# Patient Record
Sex: Male | Born: 1945 | Race: Black or African American | Hispanic: No | Marital: Married | Smoking: Never smoker
Health system: Southern US, Community
[De-identification: ages and names within clinical notes are randomized; demographics above are authoritative.]

## PROBLEM LIST (undated history)

## (undated) DIAGNOSIS — I219 Acute myocardial infarction, unspecified: Secondary | ICD-10-CM

## (undated) DIAGNOSIS — E785 Hyperlipidemia, unspecified: Secondary | ICD-10-CM

## (undated) DIAGNOSIS — K219 Gastro-esophageal reflux disease without esophagitis: Secondary | ICD-10-CM

## (undated) DIAGNOSIS — H269 Unspecified cataract: Secondary | ICD-10-CM

## (undated) DIAGNOSIS — N289 Disorder of kidney and ureter, unspecified: Secondary | ICD-10-CM

## (undated) DIAGNOSIS — G4730 Sleep apnea, unspecified: Secondary | ICD-10-CM

## (undated) DIAGNOSIS — E119 Type 2 diabetes mellitus without complications: Secondary | ICD-10-CM

## (undated) DIAGNOSIS — I10 Essential (primary) hypertension: Secondary | ICD-10-CM

## (undated) DIAGNOSIS — T7840XA Allergy, unspecified, initial encounter: Secondary | ICD-10-CM

## (undated) HISTORY — DX: Sleep apnea, unspecified: G47.30

## (undated) HISTORY — PX: BLADDER STONE REMOVAL: SHX568

## (undated) HISTORY — DX: Gastro-esophageal reflux disease without esophagitis: K21.9

## (undated) HISTORY — DX: Allergy, unspecified, initial encounter: T78.40XA

## (undated) HISTORY — PX: REPLACEMENT TOTAL KNEE: SUR1224

## (undated) HISTORY — PX: OTHER SURGICAL HISTORY: SHX169

## (undated) HISTORY — DX: Unspecified cataract: H26.9

## (undated) HISTORY — DX: Hyperlipidemia, unspecified: E78.5

---

## 2002-07-12 ENCOUNTER — Encounter: Admission: RE | Admit: 2002-07-12 | Discharge: 2002-07-12 | Payer: Self-pay | Admitting: Internal Medicine

## 2002-07-12 ENCOUNTER — Encounter: Payer: Self-pay | Admitting: Internal Medicine

## 2002-11-27 ENCOUNTER — Encounter: Admission: RE | Admit: 2002-11-27 | Discharge: 2002-11-27 | Payer: Self-pay | Admitting: Internal Medicine

## 2002-11-27 ENCOUNTER — Encounter: Payer: Self-pay | Admitting: Internal Medicine

## 2003-03-16 ENCOUNTER — Encounter: Payer: Self-pay | Admitting: Internal Medicine

## 2003-03-16 ENCOUNTER — Encounter: Admission: RE | Admit: 2003-03-16 | Discharge: 2003-03-16 | Payer: Self-pay | Admitting: Internal Medicine

## 2006-06-25 ENCOUNTER — Encounter: Admission: RE | Admit: 2006-06-25 | Discharge: 2006-06-25 | Payer: Self-pay | Admitting: Internal Medicine

## 2006-07-26 ENCOUNTER — Encounter: Admission: RE | Admit: 2006-07-26 | Discharge: 2006-07-26 | Payer: Self-pay | Admitting: Internal Medicine

## 2010-07-27 ENCOUNTER — Encounter: Payer: Self-pay | Admitting: Internal Medicine

## 2016-12-26 ENCOUNTER — Emergency Department (HOSPITAL_BASED_OUTPATIENT_CLINIC_OR_DEPARTMENT_OTHER)
Admission: EM | Admit: 2016-12-26 | Discharge: 2016-12-26 | Disposition: A | Discharge status: Home / Self Care | Payer: BC Managed Care – PPO | Attending: Emergency Medicine | Admitting: Emergency Medicine

## 2016-12-26 ENCOUNTER — Encounter (HOSPITAL_BASED_OUTPATIENT_CLINIC_OR_DEPARTMENT_OTHER): Payer: Self-pay | Admitting: Emergency Medicine

## 2016-12-26 DIAGNOSIS — E119 Type 2 diabetes mellitus without complications: Secondary | ICD-10-CM | POA: Diagnosis not present

## 2016-12-26 DIAGNOSIS — R339 Retention of urine, unspecified: Secondary | ICD-10-CM

## 2016-12-26 DIAGNOSIS — R300 Dysuria: Secondary | ICD-10-CM | POA: Diagnosis present

## 2016-12-26 DIAGNOSIS — I10 Essential (primary) hypertension: Secondary | ICD-10-CM | POA: Insufficient documentation

## 2016-12-26 HISTORY — DX: Acute myocardial infarction, unspecified: I21.9

## 2016-12-26 HISTORY — DX: Essential (primary) hypertension: I10

## 2016-12-26 HISTORY — DX: Disorder of kidney and ureter, unspecified: N28.9

## 2016-12-26 HISTORY — DX: Type 2 diabetes mellitus without complications: E11.9

## 2016-12-26 LAB — COMPREHENSIVE METABOLIC PANEL
ALT: 17 U/L (ref 17–63)
ANION GAP: 13 (ref 5–15)
AST: 22 U/L (ref 15–41)
Albumin: 3.1 g/dL — ABNORMAL LOW (ref 3.5–5.0)
Alkaline Phosphatase: 62 U/L (ref 38–126)
BILIRUBIN TOTAL: 0.9 mg/dL (ref 0.3–1.2)
BUN: 13 mg/dL (ref 6–20)
CHLORIDE: 99 mmol/L — AB (ref 101–111)
CO2: 21 mmol/L — ABNORMAL LOW (ref 22–32)
Calcium: 9 mg/dL (ref 8.9–10.3)
Creatinine, Ser: 0.9 mg/dL (ref 0.61–1.24)
GFR calc Af Amer: >60 mL/min (ref >60)
GLUCOSE: 244 mg/dL — AB (ref 65–99)
POTASSIUM: 3.9 mmol/L (ref 3.5–5.1)
Sodium: 133 mmol/L — ABNORMAL LOW (ref 135–145)
TOTAL PROTEIN: 7.2 g/dL (ref 6.5–8.1)

## 2016-12-26 LAB — CBC WITH DIFFERENTIAL/PLATELET
BASOS ABS: 0 10*3/uL (ref 0.0–0.1)
Basophils Relative: 0 %
EOS PCT: 2 %
Eosinophils Absolute: 0.1 10*3/uL (ref 0.0–0.7)
HEMATOCRIT: 31.4 % — AB (ref 39.0–52.0)
HEMOGLOBIN: 10.8 g/dL — AB (ref 13.0–17.0)
LYMPHS PCT: 21 %
Lymphs Abs: 1.5 10*3/uL (ref 0.7–4.0)
MCH: 30 pg (ref 26.0–34.0)
MCHC: 34.4 g/dL (ref 30.0–36.0)
MCV: 87.2 fL (ref 78.0–100.0)
MONOS PCT: 14 %
Monocytes Absolute: 1 10*3/uL (ref 0.1–1.0)
Neutro Abs: 4.6 10*3/uL (ref 1.7–7.7)
Neutrophils Relative %: 63 %
Platelets: 298 10*3/uL (ref 150–400)
RBC: 3.6 MIL/uL — AB (ref 4.22–5.81)
RDW: 14 % (ref 11.5–15.5)
WBC: 7.2 10*3/uL (ref 4.0–10.5)

## 2016-12-26 LAB — URINALYSIS, ROUTINE W REFLEX MICROSCOPIC
Bilirubin Urine: NEGATIVE
Glucose, UA: >=500 mg/dL — AB
Hgb urine dipstick: NEGATIVE
Ketones, ur: NEGATIVE mg/dL
LEUKOCYTES UA: NEGATIVE
NITRITE: NEGATIVE
PH: 5 (ref 5.0–8.0)
Protein, ur: NEGATIVE mg/dL
SPECIFIC GRAVITY, URINE: 1.026 (ref 1.005–1.030)

## 2016-12-26 LAB — I-STAT CG4 LACTIC ACID, ED: LACTIC ACID, VENOUS: 2.03 mmol/L — AB (ref 0.5–1.9)

## 2016-12-26 LAB — URINALYSIS, MICROSCOPIC (REFLEX): RBC / HPF: NONE SEEN RBC/hpf (ref 0–5)

## 2016-12-26 MED ORDER — TAMSULOSIN HCL 0.4 MG PO CAPS: 0.4000 mg | ORAL_CAPSULE | Freq: Every day | ORAL | 0 refills | Status: DC

## 2016-12-26 NOTE — ED Triage Notes (Signed)
Pt was discharged from the hospital Friday following L knee replacement and has been having dysuria and urinary frequency since then. Pt states he did have a foley cath while in the hospital.

## 2016-12-26 NOTE — ED Provider Notes (Signed)
[facility] Provider Note   CSN: [id number] Arrival date & time: 12/26/16  5465 By signing my name below, I, Dyke Brackett, attest that this documentation has been prepared under the direction and in the presence of Davonna Belling, MD . Electronically Signed: Dyke Brackett, Scribe. 12/26/2016. 7:29 PM.   History   Chief Complaint Chief Complaint  Patient presents with  . Dysuria   HPI Jake Montgomery is a 71 y.o. male who presents to the Emergency Department complaining of dysuria onset yesterday. He reports associated difficulty urinating, increased urinary frequency, nausea, and abdominal pain. No alleviating or modifying factors noted. Pt was d/c from the hospital yesterday following total knee replacement. Per pt, he did have a foley catheter in place while in the hospital. Prior to last night, pt had not experienced any urinary symptoms. Pt denies any diarrhea, vomiting, dizziness, or lightheadedness.   The history is provided by the patient. No language interpreter was used.    Past Medical History:  Diagnosis Date  . Diabetes mellitus without complication ([place])   . Hypertension   . MI (myocardial infarction) ([place])   . Renal disorder     There are no active problems to display for this patient.   Past Surgical History:  Procedure Laterality Date  . REPLACEMENT TOTAL KNEE Left        Home Medications    Prior to Admission medications   Medication Sig Start Date End Date Taking? Authorizing Provider  UNKNOWN TO PATIENT    Yes [provider]  tamsulosin (FLOMAX) 0.4 MG CAPS capsule Take 1 capsule (0.4 mg total) by mouth daily. 12/26/16   Davonna Belling, MD    Family History No family history on file.  Social History Social History  Substance Use Topics  . Smoking status: Never Smoker  . Smokeless tobacco: Never Used  . Alcohol use Yes     Comment: occ     Allergies   Patient has no known allergies.   Review of  Systems Review of Systems  Constitutional: Negative for chills and fever.  HENT: Negative for congestion.   Respiratory: Negative for shortness of breath.   Cardiovascular: Positive for leg swelling. Negative for chest pain.  Gastrointestinal: Positive for abdominal pain and nausea. Negative for diarrhea and vomiting.  Genitourinary: Positive for decreased urine volume, difficulty urinating, dysuria and frequency.  Musculoskeletal: Negative for back pain.  Skin: Negative for rash.  Neurological: Negative for dizziness and light-headedness.   Physical Exam Updated Vital Signs BP 123/66   Pulse (!) 107   Temp 98.7 F (37.1 C) (Oral)   Resp 20   Ht 5\' 7"  (1.702 m)   Wt 106.1 kg (234 lb)   SpO2 100%   BMI 36.65 kg/m   Physical Exam  Constitutional: He is oriented to person, place, and time. He appears well-developed and well-nourished. No distress.  HENT:  Head: Normocephalic and atraumatic.  Eyes: Conjunctivae are normal.  Cardiovascular: Normal rate.   Pulmonary/Chest: Effort normal.  Abdominal: He exhibits no distension.  Suprapubic lower abdominal mass which goes up to umbilicus. No hernias.   Genitourinary:  Genitourinary Comments: No penile discharge.  Musculoskeletal:  Mild BLE pitting edema  Neurological: He is alert and oriented to person, place, and time.  Skin: Skin is warm and dry.  Dressing over left knee with slight bloody stain on it.   Nursing note and vitals reviewed.  ED Treatments / Results  DIAGNOSTIC STUDIES:  Oxygen Saturation is 100% on RA,  normal by my interpretation.    COORDINATION OF CARE:  7:28 PM Discussed treatment plan with pt at bedside and pt agreed to plan.   Labs (all labs ordered are listed, but only abnormal results are displayed) Labs Reviewed  COMPREHENSIVE METABOLIC PANEL - Abnormal; Notable for the following:       Result Value   Sodium 133 (*)    Chloride 99 (*)    CO2 21 (*)    Glucose, Bld 244 (*)    Albumin 3.1 (*)     All other components within normal limits  CBC WITH DIFFERENTIAL/PLATELET - Abnormal; Notable for the following:    RBC 3.60 (*)    Hemoglobin 10.8 (*)    HCT 31.4 (*)    All other components within normal limits  URINALYSIS, ROUTINE W REFLEX MICROSCOPIC - Abnormal; Notable for the following:    Glucose, UA >=500 (*)    All other components within normal limits  URINALYSIS, MICROSCOPIC (REFLEX) - Abnormal; Notable for the following:    Bacteria, UA FEW (*)    Squamous Epithelial / LPF 0-5 (*)    All other components within normal limits  I-STAT CG4 LACTIC ACID, ED - Abnormal; Notable for the following:    Lactic Acid, Venous 2.03 (*)    All other components within normal limits    EKG  EKG Interpretation None       Radiology No results found.  Procedures Procedures (including critical care time)  Medications Ordered in ED Medications - No data to display   Initial Impression / Assessment and Plan / ED Course  I have reviewed the triage vital signs and the nursing notes.  Pertinent labs & imaging results that were available during my care of the patient were reviewed by me and considered in my medical decision making (see chart for details).     Patient with dysuria after recent surgery. Found to have urinary retention. Feels much better after Foley catheter placed. Will treat with Flomax. No infection. Renal function today. Discharge home to follow with urology.  Final Clinical Impressions(s) / ED Diagnoses   Final diagnoses:  Urinary retention    New Prescriptions Discharge Medication List as of 12/26/2016  9:25 PM    START taking these medications   Details  tamsulosin (FLOMAX) 0.4 MG CAPS capsule Take 1 capsule (0.4 mg total) by mouth daily., Starting Sat 12/26/2016, Print       I personally performed the services described in this documentation, which was scribed in my presence. The recorded information has been reviewed and is accurate.       Davonna Belling, MD 12/26/16 2325

## 2016-12-26 NOTE — ED Notes (Addendum)
Alert, NAD, calm, interactive, resps e/u, speaking in clear complete sentences, no dyspnea noted, skin W&D, VSS, HR elevated 103, (denies: pain, sob, nausea, dizziness or visual changes).Family at [facility]. Leg bag applied.

## 2016-12-26 NOTE — ED Notes (Signed)
Report received from K.C., RN.

## 2017-05-25 ENCOUNTER — Emergency Department (HOSPITAL_BASED_OUTPATIENT_CLINIC_OR_DEPARTMENT_OTHER): Payer: BC Managed Care – PPO

## 2017-05-25 ENCOUNTER — Other Ambulatory Visit: Payer: Self-pay

## 2017-05-25 ENCOUNTER — Emergency Department (HOSPITAL_BASED_OUTPATIENT_CLINIC_OR_DEPARTMENT_OTHER)
Admission: EM | Admit: 2017-05-25 | Discharge: 2017-05-25 | Disposition: A | Discharge status: Home / Self Care | Payer: BC Managed Care – PPO | Attending: Emergency Medicine | Admitting: Emergency Medicine

## 2017-05-25 ENCOUNTER — Encounter (HOSPITAL_BASED_OUTPATIENT_CLINIC_OR_DEPARTMENT_OTHER): Payer: Self-pay | Admitting: *Deleted

## 2017-05-25 DIAGNOSIS — I10 Essential (primary) hypertension: Secondary | ICD-10-CM | POA: Insufficient documentation

## 2017-05-25 DIAGNOSIS — Y998 Other external cause status: Secondary | ICD-10-CM | POA: Diagnosis not present

## 2017-05-25 DIAGNOSIS — M542 Cervicalgia: Secondary | ICD-10-CM

## 2017-05-25 DIAGNOSIS — S39012A Strain of muscle, fascia and tendon of lower back, initial encounter: Secondary | ICD-10-CM | POA: Diagnosis not present

## 2017-05-25 DIAGNOSIS — E119 Type 2 diabetes mellitus without complications: Secondary | ICD-10-CM | POA: Diagnosis not present

## 2017-05-25 DIAGNOSIS — Z79899 Other long term (current) drug therapy: Secondary | ICD-10-CM | POA: Insufficient documentation

## 2017-05-25 DIAGNOSIS — Y92410 Unspecified street and highway as the place of occurrence of the external cause: Secondary | ICD-10-CM | POA: Diagnosis not present

## 2017-05-25 DIAGNOSIS — Z96652 Presence of left artificial knee joint: Secondary | ICD-10-CM | POA: Diagnosis not present

## 2017-05-25 DIAGNOSIS — Y9389 Activity, other specified: Secondary | ICD-10-CM | POA: Insufficient documentation

## 2017-05-25 DIAGNOSIS — I252 Old myocardial infarction: Secondary | ICD-10-CM | POA: Insufficient documentation

## 2017-05-25 DIAGNOSIS — S3992XA Unspecified injury of lower back, initial encounter: Secondary | ICD-10-CM | POA: Diagnosis present

## 2017-05-25 MED ORDER — CYCLOBENZAPRINE HCL 10 MG PO TABS: 10.0000 mg | ORAL_TABLET | Freq: Two times a day (BID) | ORAL | 0 refills | Status: DC | PRN

## 2017-05-25 MED ORDER — ACETAMINOPHEN 325 MG PO TABS
650.0000 mg | ORAL_TABLET | Freq: Once | ORAL | Status: AC
  Administered 2017-05-25: 650 mg via ORAL
  Filled 2017-05-25: qty 2

## 2017-05-25 NOTE — Discharge Instructions (Signed)
The pain your experiencing is likely due to muscle strain, you may take tylenol and flexeril as needed for pain management. Do not combine with any pain reliever other than tylenol. The muscle soreness should improve over the next week. Follow up with your family doctor in the next week for a recheck if you are still having symptoms. Return to ED if pain is worsening, you develop weakness or numbness of extremities, or new or concerning symptoms develop.
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
## 2017-11-23 ENCOUNTER — Ambulatory Visit (INDEPENDENT_AMBULATORY_CARE_PROVIDER_SITE_OTHER): Payer: Self-pay | Admitting: Orthopaedic Surgery

## 2017-12-20 ENCOUNTER — Ambulatory Visit (INDEPENDENT_AMBULATORY_CARE_PROVIDER_SITE_OTHER): Payer: Self-pay | Admitting: Orthopaedic Surgery

## 2018-01-12 ENCOUNTER — Ambulatory Visit (INDEPENDENT_AMBULATORY_CARE_PROVIDER_SITE_OTHER): Payer: Self-pay | Admitting: Orthopaedic Surgery
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
## 2021-03-18 ENCOUNTER — Encounter (HOSPITAL_BASED_OUTPATIENT_CLINIC_OR_DEPARTMENT_OTHER): Payer: Self-pay | Admitting: Emergency Medicine

## 2021-03-18 ENCOUNTER — Emergency Department (HOSPITAL_BASED_OUTPATIENT_CLINIC_OR_DEPARTMENT_OTHER)
Admission: EM | Admit: 2021-03-18 | Discharge: 2021-03-18 | Disposition: A | Discharge status: Home / Self Care | Payer: Medicare (Managed Care) | Attending: Emergency Medicine | Admitting: Emergency Medicine

## 2021-03-18 ENCOUNTER — Emergency Department (HOSPITAL_COMMUNITY): Payer: Medicare (Managed Care)

## 2021-03-18 ENCOUNTER — Other Ambulatory Visit: Payer: Self-pay

## 2021-03-18 ENCOUNTER — Other Ambulatory Visit: Payer: Self-pay | Admitting: Emergency Medicine

## 2021-03-18 ENCOUNTER — Emergency Department (HOSPITAL_BASED_OUTPATIENT_CLINIC_OR_DEPARTMENT_OTHER): Payer: Medicare (Managed Care)

## 2021-03-18 ENCOUNTER — Inpatient Hospital Stay (HOSPITAL_COMMUNITY)
Admission: EM | Admit: 2021-03-18 | Discharge: 2021-03-23 | DRG: 312 | Disposition: A | Discharge status: Home Health | Payer: Medicare (Managed Care) | Attending: Internal Medicine | Admitting: Internal Medicine

## 2021-03-18 DIAGNOSIS — R2990 Unspecified symptoms and signs involving the nervous system: Secondary | ICD-10-CM

## 2021-03-18 DIAGNOSIS — Z9989 Dependence on other enabling machines and devices: Secondary | ICD-10-CM

## 2021-03-18 DIAGNOSIS — R81 Glycosuria: Secondary | ICD-10-CM | POA: Diagnosis present

## 2021-03-18 DIAGNOSIS — I10 Essential (primary) hypertension: Secondary | ICD-10-CM | POA: Insufficient documentation

## 2021-03-18 DIAGNOSIS — I952 Hypotension due to drugs: Secondary | ICD-10-CM | POA: Diagnosis not present

## 2021-03-18 DIAGNOSIS — Z20822 Contact with and (suspected) exposure to covid-19: Secondary | ICD-10-CM | POA: Insufficient documentation

## 2021-03-18 DIAGNOSIS — R310 Gross hematuria: Principal | ICD-10-CM | POA: Diagnosis present

## 2021-03-18 DIAGNOSIS — R519 Headache, unspecified: Secondary | ICD-10-CM | POA: Insufficient documentation

## 2021-03-18 DIAGNOSIS — Z79899 Other long term (current) drug therapy: Secondary | ICD-10-CM

## 2021-03-18 DIAGNOSIS — Z87442 Personal history of urinary calculi: Secondary | ICD-10-CM | POA: Diagnosis not present

## 2021-03-18 DIAGNOSIS — E669 Obesity, unspecified: Secondary | ICD-10-CM | POA: Diagnosis present

## 2021-03-18 DIAGNOSIS — R319 Hematuria, unspecified: Secondary | ICD-10-CM

## 2021-03-18 DIAGNOSIS — Z6841 Body Mass Index (BMI) 40.0 and over, adult: Secondary | ICD-10-CM | POA: Diagnosis not present

## 2021-03-18 DIAGNOSIS — N400 Enlarged prostate without lower urinary tract symptoms: Secondary | ICD-10-CM | POA: Diagnosis not present

## 2021-03-18 DIAGNOSIS — R42 Dizziness and giddiness: Secondary | ICD-10-CM | POA: Insufficient documentation

## 2021-03-18 DIAGNOSIS — Y908 Blood alcohol level of 240 mg/100 ml or more: Secondary | ICD-10-CM | POA: Insufficient documentation

## 2021-03-18 DIAGNOSIS — G4733 Obstructive sleep apnea (adult) (pediatric): Secondary | ICD-10-CM

## 2021-03-18 DIAGNOSIS — T447X5A Adverse effect of beta-adrenoreceptor antagonists, initial encounter: Secondary | ICD-10-CM | POA: Diagnosis not present

## 2021-03-18 DIAGNOSIS — Z87440 Personal history of urinary (tract) infections: Secondary | ICD-10-CM | POA: Diagnosis not present

## 2021-03-18 DIAGNOSIS — E119 Type 2 diabetes mellitus without complications: Secondary | ICD-10-CM | POA: Diagnosis present

## 2021-03-18 DIAGNOSIS — Z96652 Presence of left artificial knee joint: Secondary | ICD-10-CM | POA: Insufficient documentation

## 2021-03-18 DIAGNOSIS — R55 Syncope and collapse: Secondary | ICD-10-CM | POA: Diagnosis not present

## 2021-03-18 DIAGNOSIS — Z9181 History of falling: Secondary | ICD-10-CM | POA: Diagnosis not present

## 2021-03-18 DIAGNOSIS — I2510 Atherosclerotic heart disease of native coronary artery without angina pectoris: Secondary | ICD-10-CM | POA: Diagnosis not present

## 2021-03-18 DIAGNOSIS — I440 Atrioventricular block, first degree: Secondary | ICD-10-CM | POA: Diagnosis present

## 2021-03-18 DIAGNOSIS — E1169 Type 2 diabetes mellitus with other specified complication: Secondary | ICD-10-CM | POA: Diagnosis present

## 2021-03-18 DIAGNOSIS — R200 Anesthesia of skin: Secondary | ICD-10-CM | POA: Insufficient documentation

## 2021-03-18 DIAGNOSIS — I252 Old myocardial infarction: Secondary | ICD-10-CM

## 2021-03-18 DIAGNOSIS — R0602 Shortness of breath: Secondary | ICD-10-CM

## 2021-03-18 DIAGNOSIS — E66813 Obesity, class 3: Secondary | ICD-10-CM

## 2021-03-18 DIAGNOSIS — R531 Weakness: Secondary | ICD-10-CM

## 2021-03-18 LAB — COMPREHENSIVE METABOLIC PANEL
ALT: 23 U/L (ref 0–44)
AST: 20 U/L (ref 15–41)
Albumin: 3.7 g/dL (ref 3.5–5.0)
Alkaline Phosphatase: 78 U/L (ref 38–126)
Anion gap: 6 (ref 5–15)
BUN: 9 mg/dL (ref 8–23)
CO2: 25 mmol/L (ref 22–32)
Calcium: 8.7 mg/dL — ABNORMAL LOW (ref 8.9–10.3)
Chloride: 101 mmol/L (ref 98–111)
Creatinine, Ser: 0.93 mg/dL (ref 0.61–1.24)
GFR, Estimated: >60 mL/min (ref >60)
Glucose, Bld: 209 mg/dL — ABNORMAL HIGH (ref 70–99)
Potassium: 4.2 mmol/L (ref 3.5–5.1)
Sodium: 132 mmol/L — ABNORMAL LOW (ref 135–145)
Total Bilirubin: 0.6 mg/dL (ref 0.3–1.2)
Total Protein: 7.4 g/dL (ref 6.5–8.1)

## 2021-03-18 LAB — PROTIME-INR
INR: 1.1 (ref 0.8–1.2)
Prothrombin Time: 13.8 seconds (ref 11.4–15.2)

## 2021-03-18 LAB — RAPID URINE DRUG SCREEN, HOSP PERFORMED
Amphetamines: NOT DETECTED
Barbiturates: NOT DETECTED
Benzodiazepines: NOT DETECTED
Cocaine: NOT DETECTED
Opiates: NOT DETECTED
Tetrahydrocannabinol: NOT DETECTED

## 2021-03-18 LAB — CBG MONITORING, ED: Glucose-Capillary: 196 mg/dL — ABNORMAL HIGH (ref 70–99)

## 2021-03-18 LAB — DIFFERENTIAL
Abs Immature Granulocytes: 0.03 10*3/uL (ref 0.00–0.07)
Basophils Absolute: 0.1 10*3/uL (ref 0.0–0.1)
Basophils Relative: 1 %
Eosinophils Absolute: 0.1 10*3/uL (ref 0.0–0.5)
Eosinophils Relative: 3 %
Immature Granulocytes: 1 %
Lymphocytes Relative: 32 %
Lymphs Abs: 1.7 10*3/uL (ref 0.7–4.0)
Monocytes Absolute: 0.6 10*3/uL (ref 0.1–1.0)
Monocytes Relative: 12 %
Neutro Abs: 2.8 10*3/uL (ref 1.7–7.7)
Neutrophils Relative %: 51 %

## 2021-03-18 LAB — CBC
HCT: 42.9 % (ref 39.0–52.0)
Hemoglobin: 14.5 g/dL (ref 13.0–17.0)
MCH: 30.1 pg (ref 26.0–34.0)
MCHC: 33.8 g/dL (ref 30.0–36.0)
MCV: 89 fL (ref 80.0–100.0)
Platelets: 267 10*3/uL (ref 150–400)
RBC: 4.82 MIL/uL (ref 4.22–5.81)
RDW: 13.7 % (ref 11.5–15.5)
WBC: 5.4 10*3/uL (ref 4.0–10.5)
nRBC: 0 % (ref 0.0–0.2)

## 2021-03-18 LAB — RESP PANEL BY RT-PCR (FLU A&B, COVID) ARPGX2
Influenza A by PCR: NEGATIVE
Influenza B by PCR: NEGATIVE
SARS Coronavirus 2 by RT PCR: NEGATIVE

## 2021-03-18 LAB — URINALYSIS, ROUTINE W REFLEX MICROSCOPIC
Bilirubin Urine: NEGATIVE
Glucose, UA: >=500 mg/dL — AB
Ketones, ur: NEGATIVE mg/dL
Leukocytes,Ua: NEGATIVE
Nitrite: NEGATIVE
Protein, ur: NEGATIVE mg/dL
Specific Gravity, Urine: 1.01 (ref 1.005–1.030)
pH: 6 (ref 5.0–8.0)

## 2021-03-18 LAB — APTT: aPTT: 25 seconds (ref 24–36)

## 2021-03-18 LAB — ETHANOL: Alcohol, Ethyl (B): <10 mg/dL (ref <10)

## 2021-03-18 MED ORDER — IOHEXOL 350 MG/ML SOLN
100.0000 mL | Freq: Once | INTRAVENOUS | Status: AC | PRN
  Administered 2021-03-18: 100 mL via INTRAVENOUS

## 2021-03-18 MED ORDER — LORAZEPAM 2 MG/ML IJ SOLN
0.5000 mg | Freq: Once | INTRAMUSCULAR | Status: AC
  Administered 2021-03-18: 0.5 mg via INTRAVENOUS
  Filled 2021-03-18: qty 1

## 2021-03-18 NOTE — Discharge Instructions (Addendum)
You were evaluated in the Emergency Department and after careful evaluation, we did not find any emergent condition requiring admission or further testing in the hospital.  Please take aspirin '325mg'$  once daily. Follow up with neurology whose contact information I have provided in your discharge paperwork. Please follow up with your primary care doctor or cardiologist about your EKG findings.  Please return to the Emergency Department if you experience any worsening of your condition. Thank you for allowing [place] to be a part of your care.

## 2021-03-18 NOTE — ED Notes (Signed)
Transferred by Karen Chafe

## 2021-03-18 NOTE — ED Provider Notes (Signed)
[facility] Provider Note   CSN: [id number] Arrival date & time: 03/18/21  1118     History Chief Complaint  Patient presents with   Loss of Consciousness   Left facial tingling    [facility] is a 75 y.o. male.  HPI  75 year old male with past medical history of HTN, DM, CAD presents the emergency department with concern for headache, facial numbness and dizziness.  Just prior to arrival patient had an assumed syncopal episode.  He states he remembers leaning over to get something for the dog when he woke up on the ground.  Wife states that she found him confused with slurred speech.  When the patient woke up he started complaining of posterior headache, dizziness and numbness to the left side of the face.  This is persistent and present on my initial evaluation.  Denies any facial droop, vision changes or unilateral weakness.  Last known normal was 10:30 AM, approximately an hour prior to arrival.  Past Medical History:  Diagnosis Date   Diabetes mellitus without complication ([place])    Hypertension    MI (myocardial infarction) ([place])    Renal disorder     There are no problems to display for this patient.   Past Surgical History:  Procedure Laterality Date   REPLACEMENT TOTAL KNEE Left        History reviewed. No pertinent family history.  Social History   Tobacco Use   Smoking status: Never   Smokeless tobacco: Never  Substance Use Topics   Alcohol use: Yes    Comment: occ   Drug use: No    Home Medications Prior to Admission medications   Medication Sig Start Date End Date Taking? Authorizing Provider  cyclobenzaprine (FLEXERIL) 10 MG tablet Take 1 tablet (10 mg total) by mouth 2 (two) times daily as needed for muscle spasms. 05/25/17   Jacqlyn Larsen, PA-C  tamsulosin (FLOMAX) 0.4 MG CAPS capsule Take 1 capsule (0.4 mg total) by mouth daily. 12/26/16   Davonna Belling, MD  UNKNOWN TO PATIENT     [provider]     Allergies    Patient has no known allergies.  Review of Systems   Review of Systems  Constitutional:  Negative for chills and fever.  HENT:  Negative for congestion.   Eyes:  Negative for visual disturbance.  Respiratory:  Negative for shortness of breath.   Cardiovascular:  Negative for chest pain.  Gastrointestinal:  Negative for abdominal pain, diarrhea and vomiting.  Genitourinary:  Negative for dysuria.  Skin:  Negative for rash.  Neurological:  Positive for dizziness, speech difficulty, numbness and headaches. Negative for facial asymmetry and weakness.   Physical Exam Updated Vital Signs BP (!) 145/80 (BP Location: Right Arm)   Pulse 78   Temp 98 F (36.7 C) (Oral)   Resp (!) 22   Ht '5\' 8"'$  (1.727 m)   Wt 121.6 kg   SpO2 99%   BMI 40.75 kg/m   Physical Exam Vitals and nursing note reviewed.  Constitutional:      Appearance: Normal appearance.  HENT:     Head: Normocephalic.     Mouth/Throat:     Mouth: Mucous membranes are moist.  Cardiovascular:     Rate and Rhythm: Normal rate.  Pulmonary:     Effort: Pulmonary effort is normal. No respiratory distress.  Abdominal:     Palpations: Abdomen is soft.     Tenderness: There is no abdominal tenderness.  Skin:    General: Skin is warm.  Neurological:     Mental Status: He is alert and oriented to person, place, and time.     Cranial Nerves: No cranial nerve deficit.     Comments: Reported numbness on the left side of the face involving the forehead, cheek and chin as well as numbness in the left lower extremity extending from the knee to the foot, equal grip strength, no drift or noted ataxia, speech is reported to be baseline  Psychiatric:        Mood and Affect: Mood normal.    ED Results / Procedures / Treatments   Labs (all labs ordered are listed, but only abnormal results are displayed) Labs Reviewed  CBG MONITORING, ED - Abnormal; Notable for the following components:      Result Value    Glucose-Capillary 196 (*)    All other components within normal limits  RESP PANEL BY RT-PCR (FLU A&B, COVID) ARPGX2  ETHANOL  PROTIME-INR  APTT  CBC  DIFFERENTIAL  COMPREHENSIVE METABOLIC PANEL  RAPID URINE DRUG SCREEN, HOSP PERFORMED  URINALYSIS, ROUTINE W REFLEX MICROSCOPIC    EKG None  Radiology CT HEAD CODE STROKE WO CONTRAST  Result Date: 03/18/2021 CLINICAL DATA:  Code stroke. EXAM: CT HEAD WITHOUT CONTRAST TECHNIQUE: Contiguous axial images were obtained from the base of the skull through the vertex without intravenous contrast. COMPARISON:  None available. FINDINGS: Brain: No evidence of acute infarction, hemorrhage, cerebral edema, mass, mass effect, or midline shift. Ventricles and sulci are normal for age. No extra-axial fluid collection. Periventricular white matter changes, likely the sequela of chronic small vessel ischemic disease. Vascular: No hyperdense vessel or unexpected calcification. Skull: Normal. Negative for fracture or focal lesion. Sinuses/Orbits: No acute finding. Other: The mastoid air cells are well aerated. ASPECTS [facility] Stroke Program Early CT Score) - Ganglionic level infarction (caudate, lentiform nuclei, internal capsule, insula, M1-M3 cortex): 7 - Supraganglionic infarction (M4-M6 cortex): 3 Total score (0-10 with 10 being normal): 10 IMPRESSION: 1. No acute intracranial process. 2. ASPECTS is 10 Code stroke imaging results were communicated on 03/18/2021 at 8:57 am to provider Dr. Dina Rich Via telephone, who verbally acknowledged these results. Electronically Signed   By: Merilyn Baba M.D.   On: 03/18/2021 11:58    Procedures .Critical Care Performed by: Lorelle Gibbs, DO Authorized by: Lorelle Gibbs, DO   Critical care provider statement:    Critical care time (minutes):  45   Critical care was necessary to treat or prevent imminent or life-threatening deterioration of the following conditions:  CNS failure or compromise   Critical care was  time spent personally by me on the following activities:  Discussions with consultants, evaluation of patient's response to treatment, examination of patient, ordering and performing treatments and interventions, ordering and review of laboratory studies, ordering and review of radiographic studies, pulse oximetry, re-evaluation of patient's condition, obtaining history from patient or surrogate and review of old charts   Medications Ordered in ED Medications - No data to display  ED Course  I have reviewed the triage vital signs and the nursing notes.  Pertinent labs & imaging results that were available during my care of the patient were reviewed by me and considered in my medical decision making (see chart for details).    MDM Rules/Calculators/A&P                           75 year old male presents  the emergency department complaint of headache, dizziness and numbness to the left face and left leg.  He had a possible syncopal episode just prior to arrival.  Vitals are stable on arrival, orthostatics are normal.  He has an NIH of 1 for subjective numbness to the left side the face and left lower extremity.  Last known normal was approximately 10:30 AM, about an hour prior to arrival.  Code stroke placed.  Dr. Quinn Axe from teleneurology called, code stroke discontinued secondary to syncope with possible head injury and low NIH, patient would not be a tPA candidate.  Head CT without shows no acute finding.  CTA/perfusion studies unremarkable.  EKG shows normal sinus rhythm with borderline first-degree AV block, no previous to compare to.  Patient had no chest pain or shortness of breath previous to this syncopal event, low suspicion for ACS.  Blood work from a metabolic standpoint is unremarkable.  On reevaluation the left-sided numbness has improved however patient continues to complain of posterior headache and dizziness even at rest that is not associated with movement.  Concern still for stroke  pathology.  Plan to transfer to [facility] for [facility] evaluation.  Dr. Sabra Heck is excepting.  Patient and wife updated and agree with transfer plan.  Final Clinical Impression(s) / ED Diagnoses Final diagnoses:  None    Rx / DC Orders ED Discharge Orders     None        Lorelle Gibbs, DO 03/18/21 1442

## 2021-03-18 NOTE — ED Triage Notes (Signed)
Pt returns to ED c/o dizziness, weakness at home, new onset hematuria. In depth workup for stroke rule out, dc w neuro follow up. Pt & family concerned for fall. Pt denies pain, fall, syncope, states he "just feels weak."

## 2021-03-18 NOTE — ED Provider Notes (Signed)
Care of the patient received s/p transfer from [facility], initially seen by Dr. Dina Rich.  Please see her note for full HPI.  In short, 75 year old male with hypertension, diabetes, coronary artery disease that presented for headache, syncopal episode with facial numbness and confusion.  He had reported some left-sided facial numbness and left extremity numbness.  Initially code stroke was called, however this was canceled by teleneurology as the patient is not a tPA candidate.  CT and CTA of the head and neck were unremarkable.  Patient continued to complain of headache with dizziness not associated with movement.  Transferred to [facility], ED for MRI imaging for rule out of stroke.  On arrival to the ED, patient states he has a mild 2/10 frontal headache, but numbness has now resolved.  He denies any chest pain, shortness of breath, dizziness, vision changes.  Physical exam unremarkable, cranial nerves II through XII intact, equal strength  and sensations in upper and lower extremities bilaterally, negative finger-to-nose, pronator drift.  Plan for MRI imaging and reevaluation.  MRI of the brain without any acute findings.  Patient reportedly back at baseline.  We will have him follow-up with PCP about EKG findings, however low suspicion for ACS with no chest pain, shortness of breath, dizziness.  Overall work-up reassuring.  Encourage patient to continue taking aspirin.  We discussed return precautions.  He voiced understanding and is agreeable.  Stable for discharge.  Results for orders placed or performed during the hospital encounter of 03/18/21  Resp Panel by RT-PCR (Flu A&B, Covid) Nasopharyngeal Swab   Specimen: Nasopharyngeal Swab; Nasopharyngeal(NP) swabs in vial transport medium  Result Value Ref Range   SARS Coronavirus 2 by RT PCR NEGATIVE NEGATIVE   Influenza A by PCR NEGATIVE NEGATIVE   Influenza B by PCR NEGATIVE NEGATIVE  Ethanol  Result Value Ref Range   Alcohol, Ethyl (B) <10 <10 mg/dL   Protime-INR  Result Value Ref Range   Prothrombin Time 13.8 11.4 - 15.2 seconds   INR 1.1 0.8 - 1.2  APTT  Result Value Ref Range   aPTT 25 24 - 36 seconds  CBC  Result Value Ref Range   WBC 5.4 4.0 - 10.5 K/uL   RBC 4.82 4.22 - 5.81 MIL/uL   Hemoglobin 14.5 13.0 - 17.0 g/dL   HCT 42.9 39.0 - 52.0 %   MCV 89.0 80.0 - 100.0 fL   MCH 30.1 26.0 - 34.0 pg   MCHC 33.8 30.0 - 36.0 g/dL   RDW 13.7 11.5 - 15.5 %   Platelets 267 150 - 400 K/uL   nRBC 0.0 0.0 - 0.2 %  Differential  Result Value Ref Range   Neutrophils Relative % 51 %   Neutro Abs 2.8 1.7 - 7.7 K/uL   Lymphocytes Relative 32 %   Lymphs Abs 1.7 0.7 - 4.0 K/uL   Monocytes Relative 12 %   Monocytes Absolute 0.6 0.1 - 1.0 K/uL   Eosinophils Relative 3 %   Eosinophils Absolute 0.1 0.0 - 0.5 K/uL   Basophils Relative 1 %   Basophils Absolute 0.1 0.0 - 0.1 K/uL   Immature Granulocytes 1 %   Abs Immature Granulocytes 0.03 0.00 - 0.07 K/uL  Comprehensive metabolic panel  Result Value Ref Range   Sodium 132 (L) 135 - 145 mmol/L   Potassium 4.2 3.5 - 5.1 mmol/L   Chloride 101 98 - 111 mmol/L   CO2 25 22 - 32 mmol/L   Glucose, Bld 209 (H) 70 - 99  mg/dL   BUN 9 8 - 23 mg/dL   Creatinine, Ser 0.93 0.61 - 1.24 mg/dL   Calcium 8.7 (L) 8.9 - 10.3 mg/dL   Total Protein 7.4 6.5 - 8.1 g/dL   Albumin 3.7 3.5 - 5.0 g/dL   AST 20 15 - 41 U/L   ALT 23 0 - 44 U/L   Alkaline Phosphatase 78 38 - 126 U/L   Total Bilirubin 0.6 0.3 - 1.2 mg/dL   GFR, Estimated >60 >60 mL/min   Anion gap 6 5 - 15  Urine rapid drug screen (hosp performed)  Result Value Ref Range   Opiates NONE DETECTED NONE DETECTED   Cocaine NONE DETECTED NONE DETECTED   Benzodiazepines NONE DETECTED NONE DETECTED   Amphetamines NONE DETECTED NONE DETECTED   Tetrahydrocannabinol NONE DETECTED NONE DETECTED   Barbiturates NONE DETECTED NONE DETECTED  Urinalysis, Routine w reflex microscopic Urine, Clean Catch  Result Value Ref Range   Color, Urine YELLOW YELLOW    APPearance HAZY (A) CLEAR   Specific Gravity, Urine 1.010 1.005 - 1.030   pH 6.0 5.0 - 8.0   Glucose, UA >=500 (A) NEGATIVE mg/dL   Hgb urine dipstick LARGE (A) NEGATIVE   Bilirubin Urine NEGATIVE NEGATIVE   Ketones, ur NEGATIVE NEGATIVE mg/dL   Protein, ur NEGATIVE NEGATIVE mg/dL   Nitrite NEGATIVE NEGATIVE   Leukocytes,Ua NEGATIVE NEGATIVE  CBG monitoring, ED  Result Value Ref Range   Glucose-Capillary 196 (H) 70 - 99 mg/dL   MR Brain Wo Contrast (neuro protocol)  Result Date: 03/18/2021 CLINICAL DATA:  Stroke suspected EXAM: MRI HEAD WITHOUT CONTRAST TECHNIQUE: Multiplanar, multiecho pulse sequences of the brain and surrounding structures were obtained without intravenous contrast. COMPARISON:  No prior MRI of the head available. Correlation is made with same day CT head FINDINGS: Evaluation is limited by motion artifact. Brain: No acute infarction, hemorrhage, hydrocephalus, extra-axial collection, or mass lesion. The ventricles and sulci are within normal limits for age. T2 hyperintense signal in the periventricular white matter, likely the sequela of chronic small vessel ischemic disease. No foci of susceptibility to suggest remote hemorrhage. Vascular: Normal flow voids. Skull and upper cervical spine: Normal marrow signal. Sinuses/Orbits: Status post right lens replacement. Otherwise negative. Other: The mastoids are well aerated. IMPRESSION: No acute intracranial process. Electronically Signed   By: Merilyn Baba M.D.   On: 03/18/2021 19:30   CT HEAD CODE STROKE WO CONTRAST  Result Date: 03/18/2021 CLINICAL DATA:  Code stroke. EXAM: CT HEAD WITHOUT CONTRAST TECHNIQUE: Contiguous axial images were obtained from the base of the skull through the vertex without intravenous contrast. COMPARISON:  None available. FINDINGS: Brain: No evidence of acute infarction, hemorrhage, cerebral edema, mass, mass effect, or midline shift. Ventricles and sulci are normal for age. No extra-axial fluid  collection. Periventricular white matter changes, likely the sequela of chronic small vessel ischemic disease. Vascular: No hyperdense vessel or unexpected calcification. Skull: Normal. Negative for fracture or focal lesion. Sinuses/Orbits: No acute finding. Other: The mastoid air cells are well aerated. ASPECTS [facility] Stroke Program Early CT Score) - Ganglionic level infarction (caudate, lentiform nuclei, internal capsule, insula, M1-M3 cortex): 7 - Supraganglionic infarction (M4-M6 cortex): 3 Total score (0-10 with 10 being normal): 10 IMPRESSION: 1. No acute intracranial process. 2. ASPECTS is 10 Code stroke imaging results were communicated on 03/18/2021 at 8:57 am to provider Dr. Dina Rich Via telephone, who verbally acknowledged these results. Electronically Signed   By: Merilyn Baba M.D.   On: 03/18/2021 11:58  CT ANGIO HEAD NECK W WO CM (CODE STROKE)  Result Date: 03/18/2021 CLINICAL DATA:  Stroke code, syncope when bending over EXAM: CT ANGIOGRAPHY HEAD AND NECK TECHNIQUE: Multidetector CT imaging of the head and neck was performed using the standard protocol during bolus administration of intravenous contrast. Multiplanar CT image reconstructions and MIPs were obtained to evaluate the vascular anatomy. Carotid stenosis measurements (when applicable) are obtained utilizing NASCET criteria, using the distal internal carotid diameter as the denominator. CONTRAST:  171m OMNIPAQUE IOHEXOL 350 MG/ML SOLN COMPARISON:  No prior CTA. FINDINGS: CT HEAD FINDINGS Please see same day CT brain stroke code. CTA NECK FINDINGS Aortic arch: Two-vessel arch with common origin of the left common carotid and brachiocephalic arteries. Imaged portion shows no evidence of aneurysm or dissection. No significant stenosis of the major arch vessel origins. Right carotid system: No evidence of dissection, stenosis (50% or greater) or occlusion. Left carotid system: No evidence of dissection, stenosis (50% or greater) or  occlusion. Vertebral arteries: Codominant. No evidence of dissection, stenosis (50% or greater) or occlusion. Skeleton: No acute osseous abnormality. Other neck: Negative Upper chest: No focal pulmonary opacity. Bibasilar atelectasis. No pleural effusion. Review of the MIP images confirms the above findings CTA HEAD FINDINGS Anterior circulation: Both internal carotid arteries are widely patent to the termini, without stenosis or other abnormality. A1 segments patent. Normal anterior communicating artery. Anterior cerebral arteries are widely patent to their distal aspects. No M1 stenosis or occlusion. Normal MCA bifurcations. Distal MCA branches well perfused and symmetric. Posterior circulation: Vertebral arteries widely patent to the vertebrobasilar junction without stenosis. Posterior inferior cerebral arteries patent bilaterally. Basilar patent to its distal aspect. Superior cerebral arteries patent bilaterally. PCAs well perfused to their distal aspects without stenosis. Venous sinuses: As permitted by contrast timing, patent. Anatomic variants: Posterior communicating arteries are not visualized. Review of the MIP images confirms the above findings IMPRESSION: No large vessel occlusion or hemodynamically significant stenosis. Electronically Signed   By: AMerilyn BabaM.D.   On: 03/18/2021 12:30        BLyndel Safe09/13/22 2011    LCarmin Muskrat MD 03/19/21 1752

## 2021-03-18 NOTE — Progress Notes (Signed)
Telestroke was called for this patient with subjective numbness in L face and leg after syncopal episode at 1030. D/w Dr. Dina Rich by phone, patient has no other deficits and so has NIHSS = 1. As such he would not be a TNK candidate 2/2 low stroke scale and also the contraindication of head trauma this AM. Head CT NAICP. Dr. Dina Rich agrees with cancellation of the stroke code for above reasons.   Su Monks, MD [facility] [phone]  If 7pm- 7am, please page neurology on call as listed in [place].

## 2021-03-18 NOTE — ED Provider Notes (Signed)
MSE was initiated and I personally evaluated the patient and placed orders (if any) at  11:51 PM on March 18, 2021.  Patient returns to ED after indepth work up today for dizziness. Got home, felt dizzy like he was threatening to fall. Also reports "I pee'd nothing but blood". No pain. No fall. No syncope.   Today's Vitals   03/18/21 2340  BP: 118/64  Pulse: 98  Resp: 16  Temp: 98.1 F (36.7 C)  TempSrc: Oral  SpO2: 99%   There is no height or weight on file to calculate BMI.  Awake, alert Moves all extremities, no lateralizing weakness No conjunctival pallor.   The patient appears stable so that the remainder of the MSE may be completed by another provider.   Charlann Lange, PA-C 03/18/21 2352    Merryl Hacker, MD 03/19/21 [phone]

## 2021-03-18 NOTE — ED Notes (Signed)
Report given to [facility]. 

## 2021-03-18 NOTE — ED Triage Notes (Signed)
Patient complaining of headache 9/12. Today, bent over to pick something up off the floor and loss consciousness. Does not remember what happened.

## 2021-03-18 NOTE — ED Notes (Signed)
Report called to Thurmond Butts RN at [facility] ED

## 2021-03-18 NOTE — ED Notes (Signed)
Returned from MRI , respirations unlabored/denies pain , family ay bedside.

## 2021-03-18 NOTE — ED Triage Notes (Signed)
Pt from [facility] for MRI. Pt syncopal/fell yesterday, L sided facial numbness & bilateral blurry vision, since resolved.   147/80 HR 70 NSR A&O 20G L forearm No meds given

## 2021-03-19 ENCOUNTER — Observation Stay (HOSPITAL_COMMUNITY): Payer: Medicare (Managed Care)

## 2021-03-19 ENCOUNTER — Encounter (HOSPITAL_COMMUNITY): Payer: Self-pay | Admitting: Internal Medicine

## 2021-03-19 DIAGNOSIS — R310 Gross hematuria: Secondary | ICD-10-CM | POA: Diagnosis present

## 2021-03-19 DIAGNOSIS — G4733 Obstructive sleep apnea (adult) (pediatric): Secondary | ICD-10-CM

## 2021-03-19 DIAGNOSIS — Z9989 Dependence on other enabling machines and devices: Secondary | ICD-10-CM

## 2021-03-19 DIAGNOSIS — R55 Syncope and collapse: Secondary | ICD-10-CM

## 2021-03-19 DIAGNOSIS — E1169 Type 2 diabetes mellitus with other specified complication: Secondary | ICD-10-CM | POA: Diagnosis present

## 2021-03-19 DIAGNOSIS — I10 Essential (primary) hypertension: Secondary | ICD-10-CM | POA: Diagnosis present

## 2021-03-19 DIAGNOSIS — E669 Obesity, unspecified: Secondary | ICD-10-CM | POA: Diagnosis present

## 2021-03-19 LAB — ECHOCARDIOGRAM COMPLETE
AR max vel: 2.88 cm2
AV Area VTI: 2.43 cm2
AV Area mean vel: 2.59 cm2
AV Mean grad: 3 mmHg
AV Peak grad: 5.1 mmHg
Ao pk vel: 1.13 m/s
Area-P 1/2: 2.75 cm2
Height: 68 in
S' Lateral: 2.7 cm
Single Plane A4C EF: 70.5 %
Weight: 4288 oz

## 2021-03-19 LAB — URINALYSIS, ROUTINE W REFLEX MICROSCOPIC
Bilirubin Urine: NEGATIVE
Glucose, UA: >=500 mg/dL — AB
Ketones, ur: NEGATIVE mg/dL
Leukocytes,Ua: NEGATIVE
Nitrite: NEGATIVE
Protein, ur: NEGATIVE mg/dL
Specific Gravity, Urine: 1.015 (ref 1.005–1.030)
pH: 6 (ref 5.0–8.0)

## 2021-03-19 LAB — CBC WITH DIFFERENTIAL/PLATELET
Abs Immature Granulocytes: 0.03 10*3/uL (ref 0.00–0.07)
Basophils Absolute: 0.1 10*3/uL (ref 0.0–0.1)
Basophils Relative: 1 %
Eosinophils Absolute: 0.1 10*3/uL (ref 0.0–0.5)
Eosinophils Relative: 1 %
HCT: 45.1 % (ref 39.0–52.0)
Hemoglobin: 14.7 g/dL (ref 13.0–17.0)
Immature Granulocytes: 0 %
Lymphocytes Relative: 18 %
Lymphs Abs: 1.2 10*3/uL (ref 0.7–4.0)
MCH: 29.5 pg (ref 26.0–34.0)
MCHC: 32.6 g/dL (ref 30.0–36.0)
MCV: 90.6 fL (ref 80.0–100.0)
Monocytes Absolute: 0.7 10*3/uL (ref 0.1–1.0)
Monocytes Relative: 10 %
Neutro Abs: 4.7 10*3/uL (ref 1.7–7.7)
Neutrophils Relative %: 70 %
Platelets: 248 10*3/uL (ref 150–400)
RBC: 4.98 MIL/uL (ref 4.22–5.81)
RDW: 13.8 % (ref 11.5–15.5)
WBC: 6.7 10*3/uL (ref 4.0–10.5)
nRBC: 0 % (ref 0.0–0.2)

## 2021-03-19 LAB — CBG MONITORING, ED: Glucose-Capillary: 163 mg/dL — ABNORMAL HIGH (ref 70–99)

## 2021-03-19 LAB — BASIC METABOLIC PANEL
Anion gap: 7 (ref 5–15)
BUN: 9 mg/dL (ref 8–23)
CO2: 26 mmol/L (ref 22–32)
Calcium: 9.1 mg/dL (ref 8.9–10.3)
Chloride: 101 mmol/L (ref 98–111)
Creatinine, Ser: 1.04 mg/dL (ref 0.61–1.24)
GFR, Estimated: >60 mL/min (ref >60)
Glucose, Bld: 213 mg/dL — ABNORMAL HIGH (ref 70–99)
Potassium: 4.3 mmol/L (ref 3.5–5.1)
Sodium: 134 mmol/L — ABNORMAL LOW (ref 135–145)

## 2021-03-19 LAB — URINALYSIS, MICROSCOPIC (REFLEX): RBC / HPF: >50 RBC/hpf (ref 0–5)

## 2021-03-19 LAB — HEMOGLOBIN A1C
Hgb A1c MFr Bld: 8.1 % — ABNORMAL HIGH (ref 4.8–5.6)
Mean Plasma Glucose: 185.77 mg/dL

## 2021-03-19 LAB — TSH: TSH: 0.698 u[IU]/mL (ref 0.350–4.500)

## 2021-03-19 MED ORDER — SODIUM CHLORIDE 0.9 % IV BOLUS
500.0000 mL | Freq: Once | INTRAVENOUS | Status: AC
  Administered 2021-03-19: 500 mL via INTRAVENOUS

## 2021-03-19 MED ORDER — MORPHINE SULFATE (PF) 2 MG/ML IV SOLN
2.0000 mg | INTRAVENOUS | Status: DC | PRN
  Administered 2021-03-19: 2 mg via INTRAVENOUS
  Filled 2021-03-19: qty 1

## 2021-03-19 MED ORDER — INSULIN GLARGINE-YFGN 100 UNIT/ML ~~LOC~~ SOLN
40.0000 [IU] | Freq: Every day | SUBCUTANEOUS | Status: DC
  Administered 2021-03-19 – 2021-03-23 (×5): 40 [IU] via SUBCUTANEOUS
  Filled 2021-03-19 (×5): qty 0.4

## 2021-03-19 MED ORDER — PRAVASTATIN SODIUM 10 MG PO TABS
20.0000 mg | ORAL_TABLET | Freq: Every day | ORAL | Status: DC
  Administered 2021-03-19 – 2021-03-22 (×4): 20 mg via ORAL
  Filled 2021-03-19 (×4): qty 2

## 2021-03-19 MED ORDER — SODIUM CHLORIDE 0.9% FLUSH
3.0000 mL | Freq: Two times a day (BID) | INTRAVENOUS | Status: DC
  Administered 2021-03-19 – 2021-03-23 (×7): 3 mL via INTRAVENOUS

## 2021-03-19 MED ORDER — INSULIN DEGLUDEC 100 UNIT/ML ~~LOC~~ SOPN
40.0000 [IU] | PEN_INJECTOR | Freq: Every day | SUBCUTANEOUS | Status: DC
  Filled 2021-03-19: qty 3

## 2021-03-19 MED ORDER — INSULIN ASPART 100 UNIT/ML IJ SOLN
0.0000 [IU] | Freq: Three times a day (TID) | INTRAMUSCULAR | Status: DC
  Administered 2021-03-19 – 2021-03-20 (×3): 3 [IU] via SUBCUTANEOUS
  Administered 2021-03-20: 5 [IU] via SUBCUTANEOUS
  Administered 2021-03-22 (×2): 3 [IU] via SUBCUTANEOUS
  Administered 2021-03-23: 2 [IU] via SUBCUTANEOUS

## 2021-03-19 MED ORDER — IRBESARTAN 300 MG PO TABS
300.0000 mg | ORAL_TABLET | Freq: Every day | ORAL | Status: DC
  Filled 2021-03-19: qty 1

## 2021-03-19 MED ORDER — ENOXAPARIN SODIUM 40 MG/0.4ML IJ SOSY
40.0000 mg | PREFILLED_SYRINGE | INTRAMUSCULAR | Status: DC
  Administered 2021-03-19 – 2021-03-22 (×4): 40 mg via SUBCUTANEOUS
  Filled 2021-03-19 (×4): qty 0.4

## 2021-03-19 MED ORDER — ACETAMINOPHEN 325 MG PO TABS
650.0000 mg | ORAL_TABLET | Freq: Four times a day (QID) | ORAL | Status: DC | PRN
  Administered 2021-03-19 – 2021-03-22 (×5): 650 mg via ORAL
  Filled 2021-03-19 (×5): qty 2

## 2021-03-19 MED ORDER — CARVEDILOL 12.5 MG PO TABS
12.5000 mg | ORAL_TABLET | Freq: Two times a day (BID) | ORAL | Status: DC
  Administered 2021-03-19: 12.5 mg via ORAL
  Filled 2021-03-19: qty 1
  Filled 2021-03-19: qty 4

## 2021-03-19 MED ORDER — ACETAMINOPHEN 650 MG RE SUPP: 650.0000 mg | Freq: Four times a day (QID) | RECTAL | Status: DC | PRN

## 2021-03-19 MED ORDER — ONDANSETRON HCL 4 MG PO TABS: 4.0000 mg | ORAL_TABLET | Freq: Four times a day (QID) | ORAL | Status: DC | PRN

## 2021-03-19 MED ORDER — ONDANSETRON HCL 4 MG/2ML IJ SOLN: 4.0000 mg | Freq: Four times a day (QID) | INTRAMUSCULAR | Status: DC | PRN

## 2021-03-19 NOTE — ED Notes (Signed)
Pt wife at bedside. Pt sheets were wet and so was pt clothes. Pt sheets changed and pt changed into hosp gown at this time. Pt sat up to side of the bed to eat at this time.

## 2021-03-19 NOTE — ED Notes (Signed)
Pt daughter called to complain about wait time. States that the fact that he was just discharged yesterday should allow him to be expedited to be seen sooner. Apologized for incontinences and explained wait time. Pt daughter asked that it be notate that she called with concerns for the situation.

## 2021-03-19 NOTE — ED Notes (Signed)
Pt needed to have a BM and stated he couldn't use the bedside commode. Requested to be wheelchaired to restroom. Pt assisted to wheelchair and wife transported pt to the restroom and stayed with pt in the restroom at this time

## 2021-03-19 NOTE — ED Notes (Signed)
Received verbal report from [place] at this time

## 2021-03-19 NOTE — ED Notes (Signed)
Pt wife outside of room stating pt needs pain medication and that she would be leaving at 2200. Wife also asked about a recliner for her. Advised her that we didn't have any available

## 2021-03-19 NOTE — H&P (Signed)
History and Physical    Jake Montgomery [id number] DOB: 11-06-1945 DOA: 03/18/2021  PCP: Jilda Panda, MD Consultants:  Camillo Flaming - pulmonology; Posey Pronto - endocrinology; Puschinsky - urology;  Patient coming from:  Home - lives with wife; Donald Prose: Wife, [phone]  Chief Complaint: Weakness  HPI: Jake Montgomery is a 75 y.o. male with medical history significant of DM; HTN; CAD; and OSA on CPAP presenting with weakness.  He came in the first time because he fell and blanked out.  He bent over and next thing he knew they were picking him up off the floor.  He was disoriented and had tingling in his face.  He had a bad headache for the last couple of weeks.  He came to the ER with negative imaging.  He went home, felt very lightheaded and dizzy.  He managed to get in the house with help, his head was spinning.  He noticed he was having gross hematuria.  No urinary symptoms.  He has had this issue before, he had a catheter in the past and got a catheter-associated infection.  He had a Urolift in 05/2020; he was last treated for a UTI last month.  He does a history of nephrolithiasis.  He has been feeling SOB for a while and is tired often.  He did feel like he was going to pass out a second time yesterday after ER d/c.  The first episode was true syncope, unconscious for at least a minute.    ED Course: Bounceback in 24 hours - yesterday with complete stroke work-up including MRI and negative.  Sent home and almost passed out - weak, almost fell.  Looks deconditioned.  ?orthostatic.  Unremarkable evaluation.   Review of Systems: As per HPI; otherwise review of systems reviewed and negative.   Ambulatory Status:  Ambulates with a cane, uses a scooter in stores  COVID Vaccine Status:  Complete plus 2 boosters  Past Medical History:  Diagnosis Date   Diabetes mellitus without complication (HCC)    Hypertension    MI (myocardial infarction) ([place])    Renal disorder    reucrrent UTI, nephrolithiasis     Past Surgical History:  Procedure Laterality Date   REPLACEMENT TOTAL KNEE Left     Social History   Socioeconomic History   Marital status: Married    Spouse name: Not on file   Number of children: Not on file   Years of education: Not on file   Highest education level: Not on file  Occupational History   Not on file  Tobacco Use   Smoking status: Never   Smokeless tobacco: Never  Substance and Sexual Activity   Alcohol use: Yes    Comment: occ   Drug use: No   Sexual activity: Not on file  Other Topics Concern   Not on file  Social History Narrative   Not on file   Social Determinants of Health   Financial Resource Strain: Not on file  Food Insecurity: Not on file  Transportation Needs: Not on file  Physical Activity: Not on file  Stress: Not on file  Social Connections: Not on file  Intimate Partner Violence: Not on file    No Known Allergies  No family history on file.  Prior to Admission medications   Medication Sig Start Date End Date Taking? Authorizing Provider  cyclobenzaprine (FLEXERIL) 10 MG tablet Take 1 tablet (10 mg total) by mouth 2 (two) times daily as needed for muscle spasms. 05/25/17  Jacqlyn Larsen, PA-C  tamsulosin (FLOMAX) 0.4 MG CAPS capsule Take 1 capsule (0.4 mg total) by mouth daily. 12/26/16   Davonna Belling, MD  UNKNOWN TO PATIENT     [provider]    Physical Exam: Vitals:   03/19/21 0939 03/19/21 1200 03/19/21 1345 03/19/21 1400  BP: (!) 148/63 (!) 152/74 (!) 132/122 110/86  Pulse: 98 96 96 (!) 112  Resp: 18 (!) 32 (!) 42 (!) 26  Temp:      TempSrc:      SpO2: 97% 98% 100% 100%     General:  Appears calm and comfortable and is in NAD Eyes:  PERRL, EOMI, normal lids, iris ENT:  grossly normal hearing, lips & tongue, mmm; some absent  dentition Neck:  no LAD, masses or thyromegaly Cardiovascular:  RRR, no m/r/g. No LE edema.  Respiratory:   CTA bilaterally with no wheezes/rales/rhonchi.  Normal  respiratory effort. Abdomen:  soft, NT, ND Skin:  no rash or induration seen on limited exam Musculoskeletal:  grossly normal tone BUE/BLE, good ROM, no bony abnormality Lower extremity:  No LE edema.  Limited foot exam with no ulcerations.  2+ distal pulses. Psychiatric:  grossly normal mood and affect, speech fluent and appropriate, AOx3 Neurologic:  CN 2-12 grossly intact, moves all extremities in coordinated fashion    Radiological Exams on Admission: Independently reviewed - see discussion in A/P where applicable  MR Brain Wo Contrast (neuro protocol)  Result Date: 03/18/2021 CLINICAL DATA:  Stroke suspected EXAM: MRI HEAD WITHOUT CONTRAST TECHNIQUE: Multiplanar, multiecho pulse sequences of the brain and surrounding structures were obtained without intravenous contrast. COMPARISON:  No prior MRI of the head available. Correlation is made with same day CT head FINDINGS: Evaluation is limited by motion artifact. Brain: No acute infarction, hemorrhage, hydrocephalus, extra-axial collection, or mass lesion. The ventricles and sulci are within normal limits for age. T2 hyperintense signal in the periventricular white matter, likely the sequela of chronic small vessel ischemic disease. No foci of susceptibility to suggest remote hemorrhage. Vascular: Normal flow voids. Skull and upper cervical spine: Normal marrow signal. Sinuses/Orbits: Status post right lens replacement. Otherwise negative. Other: The mastoids are well aerated. IMPRESSION: No acute intracranial process. Electronically Signed   By: Merilyn Baba M.D.   On: 03/18/2021 19:30   US RENAL  Result Date: 03/19/2021 CLINICAL DATA:  Hematuria. EXAM: RENAL / URINARY TRACT ULTRASOUND COMPLETE COMPARISON:  CT abdomen and pelvis 08/29/2018 FINDINGS: Image quality is degraded by patient body habitus. Right Kidney: Renal measurements: 9.9 x 5.8 x 5.4 cm = volume: 162 mL. Echogenicity within normal limits. No mass or hydronephrosis visualized.  Left Kidney: Renal measurements: 12.1 x 6.3 x 5.2 cm = volume: 210 mL. Echogenicity within normal limits. No mass or hydronephrosis visualized. Bladder: Appears normal for degree of bladder distention. Other: None. IMPRESSION: Negative renal ultrasound. Electronically Signed   By: Logan Bores M.D.   On: 03/19/2021 13:28   CT HEAD CODE STROKE WO CONTRAST  Result Date: 03/18/2021 CLINICAL DATA:  Code stroke. EXAM: CT HEAD WITHOUT CONTRAST TECHNIQUE: Contiguous axial images were obtained from the base of the skull through the vertex without intravenous contrast. COMPARISON:  None available. FINDINGS: Brain: No evidence of acute infarction, hemorrhage, cerebral edema, mass, mass effect, or midline shift. Ventricles and sulci are normal for age. No extra-axial fluid collection. Periventricular white matter changes, likely the sequela of chronic small vessel ischemic disease. Vascular: No hyperdense vessel or unexpected calcification. Skull: Normal. Negative for fracture  or focal lesion. Sinuses/Orbits: No acute finding. Other: The mastoid air cells are well aerated. ASPECTS [facility] Stroke Program Early CT Score) - Ganglionic level infarction (caudate, lentiform nuclei, internal capsule, insula, M1-M3 cortex): 7 - Supraganglionic infarction (M4-M6 cortex): 3 Total score (0-10 with 10 being normal): 10 IMPRESSION: 1. No acute intracranial process. 2. ASPECTS is 10 Code stroke imaging results were communicated on 03/18/2021 at 8:57 am to provider Dr. Dina Rich Via telephone, who verbally acknowledged these results. Electronically Signed   By: Merilyn Baba M.D.   On: 03/18/2021 11:58   CT ANGIO HEAD NECK W WO CM (CODE STROKE)  Result Date: 03/18/2021 CLINICAL DATA:  Stroke code, syncope when bending over EXAM: CT ANGIOGRAPHY HEAD AND NECK TECHNIQUE: Multidetector CT imaging of the head and neck was performed using the standard protocol during bolus administration of intravenous contrast. Multiplanar CT image  reconstructions and MIPs were obtained to evaluate the vascular anatomy. Carotid stenosis measurements (when applicable) are obtained utilizing NASCET criteria, using the distal internal carotid diameter as the denominator. CONTRAST:  151m OMNIPAQUE IOHEXOL 350 MG/ML SOLN COMPARISON:  No prior CTA. FINDINGS: CT HEAD FINDINGS Please see same day CT brain stroke code. CTA NECK FINDINGS Aortic arch: Two-vessel arch with common origin of the left common carotid and brachiocephalic arteries. Imaged portion shows no evidence of aneurysm or dissection. No significant stenosis of the major arch vessel origins. Right carotid system: No evidence of dissection, stenosis (50% or greater) or occlusion. Left carotid system: No evidence of dissection, stenosis (50% or greater) or occlusion. Vertebral arteries: Codominant. No evidence of dissection, stenosis (50% or greater) or occlusion. Skeleton: No acute osseous abnormality. Other neck: Negative Upper chest: No focal pulmonary opacity. Bibasilar atelectasis. No pleural effusion. Review of the MIP images confirms the above findings CTA HEAD FINDINGS Anterior circulation: Both internal carotid arteries are widely patent to the termini, without stenosis or other abnormality. A1 segments patent. Normal anterior communicating artery. Anterior cerebral arteries are widely patent to their distal aspects. No M1 stenosis or occlusion. Normal MCA bifurcations. Distal MCA branches well perfused and symmetric. Posterior circulation: Vertebral arteries widely patent to the vertebrobasilar junction without stenosis. Posterior inferior cerebral arteries patent bilaterally. Basilar patent to its distal aspect. Superior cerebral arteries patent bilaterally. PCAs well perfused to their distal aspects without stenosis. Venous sinuses: As permitted by contrast timing, patent. Anatomic variants: Posterior communicating arteries are not visualized. Review of the MIP images confirms the above  findings IMPRESSION: No large vessel occlusion or hemodynamically significant stenosis. Electronically Signed   By: AMerilyn BabaM.D.   On: 03/18/2021 12:30    EKG: Independently reviewed.  NSR with rate 76; new first degree AV block; nonspecific ST changes with no evidence of acute ischemia   Labs on Admission: I have personally reviewed the available labs and imaging studies at the time of the admission.  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 -Orthostatic vital signs now and in AM -2d echo ordered - patient reports h/o "CHF" that was reportedly diagnosed by his pulmonologist, does not appear to have ever had an echo -Neuro checks  -PT/OT eval and treat  Gross hematuria -Acute onset yesterday afternoon -No pain or urinary symptoms -UA unremarkable other than for hematuria and glycosuria -Renal US ordered -Hold ASA -Hold Flomax in case this is contributing to syncope (orthostasis)  DM -Will check A1c -hold Amaryl -Continue Tresibe -Cover with moderate-scale SSI   HTN -Continue Coreg, Micardis (formulary change to irbesartan)  CAD -Hold ASA -No c/o CP -Continue Pravastatin  OSA -Continue  CPAP  Obesity -BMI 40/75 -Weight loss should be encouraged -Outpatient PCP/bariatric medicine/bariatric surgery f/u encouraged    Note: This patient has been tested and is negative for the novel coronavirus COVID-19. The patient has been fully vaccinated against COVID-19.   Level of care: Telemetry Medical  DVT prophylaxis: Lovenox  Code Status:  Full - confirmed with patient Family Communication: Wife and daughter were present throughout evaluation Disposition Plan:  The patient is from: home  Anticipated d/c is to: home  Anticipated d/c date will depend on clinical response to treatment, but may be as early as tomorrow with good response  Patient is currently: acutely ill Consults called: PT/OT Admission status: It is my clinical opinion that referral for OBSERVATION is reasonable and necessary in this patient based on the above information provided. The aforementioned taken together are felt to place the patient at high risk for further clinical deterioration. However it is anticipated that the patient may be medically stable for discharge from the hospital within 24 to 48 hours.       Karmen Bongo MD [facility]   How to contact the [facility] Attending or Consulting provider [place] or covering provider during after hours [place], for this patient?  Check the care team in [facility] and look for a) attending/consulting [facility] provider listed and b) the [facility] team listed Log into [email or web address] and use [place]'s universal password to access. If you do not have the password, please contact the hospital operator. Locate the [facility] provider you are looking for under [facility] and page to a number that you can be directly reached. If you still have difficulty reaching the provider, please page the [facility] (Director on Call) for the Hospitalists listed on amion for assistance.   03/19/2021, 2:47 PM

## 2021-03-19 NOTE — ED Provider Notes (Signed)
[facility] EMERGENCY DEPARTMENT Provider Note   CSN: [id number] Arrival date & time: 03/18/21  2319     History Chief Complaint  Patient presents with   Weakness    Jake Montgomery is a 75 y.o. male with past medical history of HTN, diabetes, and CAD who presents to the ED today with chief complaint of weakness and new onset hematuria.  The patient was seen in the ED yesterday after an episode in which he bent over to pick up something for the dog and next remembers being on the floor. He does not remember falling, denies presyncopal symptoms. His wife found him on the floor and says that he seems somewhat confused, he also complained of some tingling on the left side and a headache.  She checked his glucose at that time, and it was normal.  She also states that she took his blood pressure this morning, and it was also fine.  He presented to [facility] yesterday. Initially, code stroke was called but was subsequently canceled because patient was outside of tPA window. At that time, work-up including CT, CTA, MRI was unremarkable. EKG showed normal sinus rhythm with borderline first-degree AV block, no previous to compare to. Blood work from a metabolic standpoint was also unremarkable.  Family is presenting to the ED again due to patient having ongoing weakness and another episode within the past day in which she felt dizzy. He denies left-sided numbness at this time but states that his headache has persisted. He also overall feels weak overall since his fall yesterday. His wife states that he is not quite at his baseline and says that he seems somewhat confused this morning in the waiting room.  When asked about other episodes of LOC, the patient states that this is happened to him before when he was at [facility] and bending over to pick something up, and next thing he knew he was on the floor.   In regards to new onset hematuria, the patient states that he took contrast dye  prior to CT and then started having hematuria ever since. His urine was bright red at first, however the hematuria is improving. He denies dysuria or flank pain.  States that he was completely fine before he got the dye. He denies taking any blood thinners recently.  Patient states that he has some orthopnea and SOB with exertion. He has spoken to an MD in the past about this and was told that he needed to start a water pill. The patient's family states that they are not aware that he has had an  echocardiogram performed in the past.  He does not have a history of any seizures.   Weakness Associated symptoms: dizziness and shortness of breath   Associated symptoms: no dysuria       Past Medical History:  Diagnosis Date   Diabetes mellitus without complication (HCC)    Hypertension    MI (myocardial infarction) ([place])    Renal disorder    reucrrent UTI, nephrolithiasis    Patient Active Problem List   Diagnosis Date Noted   Near syncope 03/19/2021    Past Surgical History:  Procedure Laterality Date   REPLACEMENT TOTAL KNEE Left        No family history on file.  Social History   Tobacco Use   Smoking status: Never   Smokeless tobacco: Never  Substance Use Topics   Alcohol use: Yes    Comment: occ   Drug use: No  Home Medications Prior to Admission medications   Medication Sig Start Date End Date Taking? Authorizing Provider  cyclobenzaprine (FLEXERIL) 10 MG tablet Take 1 tablet (10 mg total) by mouth 2 (two) times daily as needed for muscle spasms. 05/25/17   Jacqlyn Larsen, PA-C  tamsulosin (FLOMAX) 0.4 MG CAPS capsule Take 1 capsule (0.4 mg total) by mouth daily. 12/26/16   Davonna Belling, MD  UNKNOWN TO PATIENT     [provider]    Allergies    Patient has no known allergies.  Review of Systems   Review of Systems  Constitutional: Negative.   HENT: Negative.    Eyes: Negative.   Respiratory:  Positive for shortness of breath.    Cardiovascular: Negative.   Gastrointestinal: Negative.   Endocrine: Negative.   Genitourinary:  Positive for hematuria. Negative for dysuria and flank pain.  Musculoskeletal: Negative.   Skin: Negative.   Neurological:  Positive for dizziness and weakness. Negative for numbness.  Hematological: Negative.   Psychiatric/Behavioral:  Positive for confusion.    Physical Exam Updated Vital Signs BP (!) 148/63   Pulse 98   Temp 98.1 F (36.7 C)   Resp 18   SpO2 97%   Physical Exam Constitutional:      General: He is not in acute distress.    Appearance: Normal appearance.  HENT:     Head: Normocephalic and atraumatic.  Eyes:     Extraocular Movements: Extraocular movements intact.     Conjunctiva/sclera: Conjunctivae normal.     Pupils: Pupils are equal, round, and reactive to light.  Cardiovascular:     Rate and Rhythm: Normal rate and regular rhythm.     Heart sounds: No murmur heard.   No friction rub. No gallop.  Pulmonary:     Effort: Pulmonary effort is normal.     Breath sounds: Normal breath sounds. No wheezing, rhonchi or rales.  Abdominal:     Palpations: Abdomen is soft.     Tenderness: There is no abdominal tenderness.  Musculoskeletal:        General: No swelling, deformity or signs of injury.     Cervical back: Neck supple.     Right lower leg: No edema.     Left lower leg: No edema.  Skin:    General: Skin is warm and dry.  Neurological:     General: No focal deficit present.     Mental Status: He is alert and oriented to person, place, and time. Mental status is at baseline.     Cranial Nerves: No cranial nerve deficit.     Sensory: No sensory deficit.     Motor: No weakness.  Psychiatric:        Mood and Affect: Mood normal.        Behavior: Behavior normal.    ED Results / Procedures / Treatments   Labs (all labs ordered are listed, but only abnormal results are displayed) Labs Reviewed  URINALYSIS, ROUTINE W REFLEX MICROSCOPIC - Abnormal;  Notable for the following components:      Result Value   Color, Urine AMBER (*)    Glucose, UA >=500 (*)    Hgb urine dipstick LARGE (*)    All other components within normal limits  URINALYSIS, MICROSCOPIC (REFLEX) - Abnormal; Notable for the following components:   Bacteria, UA RARE (*)    All other components within normal limits  BASIC METABOLIC PANEL - Abnormal; Notable for the following components:   Sodium 134 (*)  Glucose, Bld 213 (*)    All other components within normal limits  CBC WITH DIFFERENTIAL/PLATELET  TSH    EKG None  Radiology MR Brain Wo Contrast (neuro protocol)  Result Date: 03/18/2021 CLINICAL DATA:  Stroke suspected EXAM: MRI HEAD WITHOUT CONTRAST TECHNIQUE: Multiplanar, multiecho pulse sequences of the brain and surrounding structures were obtained without intravenous contrast. COMPARISON:  No prior MRI of the head available. Correlation is made with same day CT head FINDINGS: Evaluation is limited by motion artifact. Brain: No acute infarction, hemorrhage, hydrocephalus, extra-axial collection, or mass lesion. The ventricles and sulci are within normal limits for age. T2 hyperintense signal in the periventricular white matter, likely the sequela of chronic small vessel ischemic disease. No foci of susceptibility to suggest remote hemorrhage. Vascular: Normal flow voids. Skull and upper cervical spine: Normal marrow signal. Sinuses/Orbits: Status post right lens replacement. Otherwise negative. Other: The mastoids are well aerated. IMPRESSION: No acute intracranial process. Electronically Signed   By: Merilyn Baba M.D.   On: 03/18/2021 19:30   CT HEAD CODE STROKE WO CONTRAST  Result Date: 03/18/2021 CLINICAL DATA:  Code stroke. EXAM: CT HEAD WITHOUT CONTRAST TECHNIQUE: Contiguous axial images were obtained from the base of the skull through the vertex without intravenous contrast. COMPARISON:  None available. FINDINGS: Brain: No evidence of acute infarction,  hemorrhage, cerebral edema, mass, mass effect, or midline shift. Ventricles and sulci are normal for age. No extra-axial fluid collection. Periventricular white matter changes, likely the sequela of chronic small vessel ischemic disease. Vascular: No hyperdense vessel or unexpected calcification. Skull: Normal. Negative for fracture or focal lesion. Sinuses/Orbits: No acute finding. Other: The mastoid air cells are well aerated. ASPECTS [facility] Stroke Program Early CT Score) - Ganglionic level infarction (caudate, lentiform nuclei, internal capsule, insula, M1-M3 cortex): 7 - Supraganglionic infarction (M4-M6 cortex): 3 Total score (0-10 with 10 being normal): 10 IMPRESSION: 1. No acute intracranial process. 2. ASPECTS is 10 Code stroke imaging results were communicated on 03/18/2021 at 8:57 am to provider Dr. Dina Rich Via telephone, who verbally acknowledged these results. Electronically Signed   By: Merilyn Baba M.D.   On: 03/18/2021 11:58   CT ANGIO HEAD NECK W WO CM (CODE STROKE)  Result Date: 03/18/2021 CLINICAL DATA:  Stroke code, syncope when bending over EXAM: CT ANGIOGRAPHY HEAD AND NECK TECHNIQUE: Multidetector CT imaging of the head and neck was performed using the standard protocol during bolus administration of intravenous contrast. Multiplanar CT image reconstructions and MIPs were obtained to evaluate the vascular anatomy. Carotid stenosis measurements (when applicable) are obtained utilizing NASCET criteria, using the distal internal carotid diameter as the denominator. CONTRAST:  150m OMNIPAQUE IOHEXOL 350 MG/ML SOLN COMPARISON:  No prior CTA. FINDINGS: CT HEAD FINDINGS Please see same day CT brain stroke code. CTA NECK FINDINGS Aortic arch: Two-vessel arch with common origin of the left common carotid and brachiocephalic arteries. Imaged portion shows no evidence of aneurysm or dissection. No significant stenosis of the major arch vessel origins. Right carotid system: No evidence of dissection,  stenosis (50% or greater) or occlusion. Left carotid system: No evidence of dissection, stenosis (50% or greater) or occlusion. Vertebral arteries: Codominant. No evidence of dissection, stenosis (50% or greater) or occlusion. Skeleton: No acute osseous abnormality. Other neck: Negative Upper chest: No focal pulmonary opacity. Bibasilar atelectasis. No pleural effusion. Review of the MIP images confirms the above findings CTA HEAD FINDINGS Anterior circulation: Both internal carotid arteries are widely patent to the termini, without stenosis or other  abnormality. A1 segments patent. Normal anterior communicating artery. Anterior cerebral arteries are widely patent to their distal aspects. No M1 stenosis or occlusion. Normal MCA bifurcations. Distal MCA branches well perfused and symmetric. Posterior circulation: Vertebral arteries widely patent to the vertebrobasilar junction without stenosis. Posterior inferior cerebral arteries patent bilaterally. Basilar patent to its distal aspect. Superior cerebral arteries patent bilaterally. PCAs well perfused to their distal aspects without stenosis. Venous sinuses: As permitted by contrast timing, patent. Anatomic variants: Posterior communicating arteries are not visualized. Review of the MIP images confirms the above findings IMPRESSION: No large vessel occlusion or hemodynamically significant stenosis. Electronically Signed   By: Merilyn Baba M.D.   On: 03/18/2021 12:30    Procedures Procedures   Medications Ordered in ED Medications  sodium chloride 0.9 % bolus 500 mL (500 mLs Intravenous New Bag/Given 03/19/21 1043)    ED Course  I have reviewed the triage vital signs and the nursing notes.  Pertinent labs & imaging results that were available during my care of the patient were reviewed by me and considered in my medical decision making (see chart for details).    MDM Rules/Calculators/A&P                         This is a 75 year old male with recent  admission for stroke rule out, now here for concerns of ongoing weakness and gross hematuria since yesterday. The patient endorses 2 episodes in which he bent over to pick something up and ended up on the floor. His work-up yesterday including head imaging and labs was overall unremarkable with the exception of hemoglobin and RBCs on UA.  Today, electrolytes are within normal limits, hemoglobin stable at 14.7. In terms of his dizziness and LOC, differential includes orthostatic hypotension vs BPPV vs seizure. In terms of his gross hematuria, differential includes nephrolithiasis, less likely items are medication-induced vs UTI given no urinary symptoms.  Patient given 500 mL fluid bolus and orthostatic vital signs obtained. TSH obtained, pending.  We have consulted the hospitalist service for admission for work-up of his dizziness and gross hematuria.   Final Clinical Impression(s) / ED Diagnoses Final diagnoses:  Gross hematuria  Generalized weakness  Syncope and collapse    Rx / DC Orders ED Discharge Orders     None        Orvis Brill, MD 03/19/21 1123    Elnora Morrison, MD 03/19/21 1130

## 2021-03-19 NOTE — ED Notes (Signed)
Per wife daughter is downstairs and made a call about getting her a recliner in the room for her to sit in. Previously made aware that we didn't have any available

## 2021-03-20 ENCOUNTER — Encounter (HOSPITAL_COMMUNITY): Payer: Self-pay | Admitting: Internal Medicine

## 2021-03-20 DIAGNOSIS — Z87440 Personal history of urinary (tract) infections: Secondary | ICD-10-CM | POA: Diagnosis not present

## 2021-03-20 DIAGNOSIS — E119 Type 2 diabetes mellitus without complications: Secondary | ICD-10-CM | POA: Diagnosis present

## 2021-03-20 DIAGNOSIS — R310 Gross hematuria: Secondary | ICD-10-CM | POA: Diagnosis present

## 2021-03-20 DIAGNOSIS — Z87442 Personal history of urinary calculi: Secondary | ICD-10-CM | POA: Diagnosis not present

## 2021-03-20 DIAGNOSIS — R81 Glycosuria: Secondary | ICD-10-CM | POA: Diagnosis present

## 2021-03-20 DIAGNOSIS — Z6841 Body Mass Index (BMI) 40.0 and over, adult: Secondary | ICD-10-CM | POA: Diagnosis not present

## 2021-03-20 DIAGNOSIS — I252 Old myocardial infarction: Secondary | ICD-10-CM | POA: Diagnosis not present

## 2021-03-20 DIAGNOSIS — I10 Essential (primary) hypertension: Secondary | ICD-10-CM | POA: Diagnosis present

## 2021-03-20 DIAGNOSIS — Z79899 Other long term (current) drug therapy: Secondary | ICD-10-CM | POA: Diagnosis not present

## 2021-03-20 DIAGNOSIS — I440 Atrioventricular block, first degree: Secondary | ICD-10-CM | POA: Diagnosis present

## 2021-03-20 DIAGNOSIS — R55 Syncope and collapse: Secondary | ICD-10-CM | POA: Diagnosis present

## 2021-03-20 DIAGNOSIS — Z9181 History of falling: Secondary | ICD-10-CM | POA: Diagnosis not present

## 2021-03-20 DIAGNOSIS — N400 Enlarged prostate without lower urinary tract symptoms: Secondary | ICD-10-CM | POA: Diagnosis present

## 2021-03-20 DIAGNOSIS — I2510 Atherosclerotic heart disease of native coronary artery without angina pectoris: Secondary | ICD-10-CM | POA: Diagnosis present

## 2021-03-20 DIAGNOSIS — R531 Weakness: Secondary | ICD-10-CM | POA: Diagnosis present

## 2021-03-20 DIAGNOSIS — T447X5A Adverse effect of beta-adrenoreceptor antagonists, initial encounter: Secondary | ICD-10-CM | POA: Diagnosis present

## 2021-03-20 DIAGNOSIS — I952 Hypotension due to drugs: Secondary | ICD-10-CM | POA: Diagnosis present

## 2021-03-20 DIAGNOSIS — Z96652 Presence of left artificial knee joint: Secondary | ICD-10-CM | POA: Diagnosis present

## 2021-03-20 DIAGNOSIS — G4733 Obstructive sleep apnea (adult) (pediatric): Secondary | ICD-10-CM | POA: Diagnosis present

## 2021-03-20 LAB — CBC
HCT: 38.6 % — ABNORMAL LOW (ref 39.0–52.0)
Hemoglobin: 12.8 g/dL — ABNORMAL LOW (ref 13.0–17.0)
MCH: 29.2 pg (ref 26.0–34.0)
MCHC: 33.2 g/dL (ref 30.0–36.0)
MCV: 88.1 fL (ref 80.0–100.0)
Platelets: 217 10*3/uL (ref 150–400)
RBC: 4.38 MIL/uL (ref 4.22–5.81)
RDW: 13.8 % (ref 11.5–15.5)
WBC: 8.6 10*3/uL (ref 4.0–10.5)
nRBC: 0 % (ref 0.0–0.2)

## 2021-03-20 LAB — BASIC METABOLIC PANEL
Anion gap: 7 (ref 5–15)
BUN: 14 mg/dL (ref 8–23)
CO2: 22 mmol/L (ref 22–32)
Calcium: 8.5 mg/dL — ABNORMAL LOW (ref 8.9–10.3)
Chloride: 101 mmol/L (ref 98–111)
Creatinine, Ser: 1.12 mg/dL (ref 0.61–1.24)
GFR, Estimated: >60 mL/min (ref >60)
Glucose, Bld: 167 mg/dL — ABNORMAL HIGH (ref 70–99)
Potassium: 4.2 mmol/L (ref 3.5–5.1)
Sodium: 130 mmol/L — ABNORMAL LOW (ref 135–145)

## 2021-03-20 LAB — PROCALCITONIN: Procalcitonin: 0.88 ng/mL

## 2021-03-20 LAB — GLUCOSE, CAPILLARY
Glucose-Capillary: 162 mg/dL — ABNORMAL HIGH (ref 70–99)
Glucose-Capillary: 171 mg/dL — ABNORMAL HIGH (ref 70–99)
Glucose-Capillary: 210 mg/dL — ABNORMAL HIGH (ref 70–99)
Glucose-Capillary: 123 mg/dL — ABNORMAL HIGH (ref 70–99)

## 2021-03-20 MED ORDER — CARVEDILOL 3.125 MG PO TABS
3.1250 mg | ORAL_TABLET | Freq: Two times a day (BID) | ORAL | Status: DC
  Administered 2021-03-21 – 2021-03-23 (×4): 3.125 mg via ORAL
  Filled 2021-03-20 (×6): qty 1

## 2021-03-20 MED ORDER — LACTATED RINGERS IV SOLN: INTRAVENOUS | Status: DC

## 2021-03-20 NOTE — Evaluation (Signed)
Occupational Therapy Evaluation Patient Details Name: Jake Montgomery MRN: [id number] DOB: June 27, 1946 Today's Date: 03/20/2021   History of Present Illness 75 y.o. male presented 03/19/21 with weakness and syncope. Seen in ED with negative MRI, sent home, near syncope with dizziness/vertigo and returned to ED.   PMH significant of DM; HTN; CAD; and OSA on CPAP .   Clinical Impression   PTA, pt was living at home with his wife, pt reports he was independent with ADL/IADL and modified independent with functional mobility at spc level. Pt reports near fall Tuesday due to lightheadedness. Pt also endorses feeling lightheaded when bending down to pick item up or for LB dressing. He reports feeling "increased pressure in head" when bent over and once upright, feels like his "head is empty", typically lightheadedness resolves once upright. Pt also endorses feeling lightheaded with sit>stand.    Pt currently requires minguard for functional mobility without AD. He requires minA for LB dressing and would benefit from AE. Pt limited this session secondary to increased lightheaded feeling with standing, BP below.  Due to decline in current level of function, pt would benefit from acute OT to address established goals to facilitate safe D/C to venue listed below. No follow-up therapy recommended at this time.   BP  113/40mHg   upon arrival, pt sitting in recliner 82/575mg 5 min into session (pt endorses lightheaded when eyes closed)  82/5483m standing initially 93/32m74mstanding 3 min 119/48mm54mtanding 5 min       Recommendations for follow up therapy are one component of a multi-disciplinary discharge planning process, led by the attending physician.  Recommendations may be updated based on patient status, additional functional criteria and insurance authorization.   Follow Up Recommendations  No OT follow up    Equipment Recommendations  None recommended by OT    Recommendations for Other  Services       Precautions / Restrictions Precautions Precautions: Fall Precaution Comments: recent syncope with fall Restrictions Weight Bearing Restrictions: No      Mobility Bed Mobility               General bed mobility comments: up in recliner pre and post-session    Transfers Overall transfer level: Needs assistance Equipment used: Rolling walker (2 wheeled) Transfers: Sit to/from StandOmnicareto Stand: Min guard         General transfer comment: minguard with no AD, pt did not have his cane with him    Balance Overall balance assessment: Mild deficits observed, not formally tested                                         ADL either performed or assessed with clinical judgement   ADL Overall ADL's : Needs assistance/impaired Eating/Feeding: Independent   Grooming: Min guard;Standing   Upper Body Bathing: Independent;Sitting   Lower Body Bathing: Min guard;Sit to/from stand   Upper Body Dressing : Independent;Sitting   Lower Body Dressing: Min guard;Sit to/from stand Lower Body Dressing Details (indicate cue type and reason): pt with poor figure-4, would benefit from AE, began education regarding use of AE Toilet Transfer: Min guard   Toileting- ClothWater quality scientistHygiene: Min guard;Sit to/from stand       Functional mobility during ADLs: Min guard;Cane General ADL Comments: minguard for safety due to orthostatic BP     Vision  Perception     Praxis      Pertinent Vitals/Pain Pain Assessment: No/denies pain     Hand Dominance Right   Extremity/Trunk Assessment Upper Extremity Assessment Upper Extremity Assessment: Overall WFL for tasks assessed   Lower Extremity Assessment Lower Extremity Assessment: Overall WFL for tasks assessed   Cervical / Trunk Assessment Cervical / Trunk Assessment: Normal   Communication Communication Communication: No difficulties   Cognition  Arousal/Alertness: Awake/alert Behavior During Therapy: WFL for tasks assessed/performed Overall Cognitive Status: Within Functional Limits for tasks assessed                                     General Comments  pt reports his dizziness is usually lightheadedness, but occasionally feels like the earth is moving or is "flat"    Exercises     Shoulder Instructions      Home Living Family/patient expects to be discharged to:: Private residence Living Arrangements: Spouse/significant other Available Help at Discharge: Family;Available 24 hours/day Type of Home: House Home Access: Level entry     Home Layout: One level     Bathroom Shower/Tub: Tub/shower unit;Door   Bathroom Toilet: Handicapped height Bathroom Accessibility: Yes   Home Equipment: Environmental consultant - 2 wheels;Cane - quad          Prior Functioning/Environment Level of Independence: Independent with assistive device(s)        Comments: uses cane since his knee surgery 3 years ago due to feeling unsteady; has had near falls and catches himself; sometimes uses an Chiropodist shopping;pt reports dizziness when bending over to complete LB dressing or pick item up off the floor        OT Problem List: Impaired balance (sitting and/or standing)      OT Treatment/Interventions: Self-care/ADL training;Therapeutic exercise;DME and/or AE instruction;Patient/family education;Balance training    OT Goals(Current goals can be found in the care plan section) Acute Rehab OT Goals Patient Stated Goal: go home soon OT Goal Formulation: With patient Time For Goal Achievement: 04/03/21 Potential to Achieve Goals: Good ADL Goals Pt Will Perform Grooming: with modified independence;standing Pt Will Perform Lower Body Dressing: with modified independence;sit to/from stand;with adaptive equipment Pt Will Transfer to Toilet: with modified independence;ambulating Additional ADL Goal #1: Pt will demonstrate  indepndence with 3 fall risk prevention strategies for safe engagement in ADL/IADL and functional mobility.  OT Frequency: Min 2X/week   Barriers to D/C:            Co-evaluation              AM-PAC OT "6 Clicks" Daily Activity     Outcome Measure Help from another person eating meals?: None Help from another person taking care of personal grooming?: A Little Help from another person toileting, which includes using toliet, bedpan, or urinal?: A Little Help from another person bathing (including washing, rinsing, drying)?: A Little Help from another person to put on and taking off regular upper body clothing?: None Help from another person to put on and taking off regular lower body clothing?: A Little 6 Click Score: 20   End of Session Nurse Communication: Mobility status  Activity Tolerance: Patient tolerated treatment well Patient left: with call bell/phone within reach;in chair  OT Visit Diagnosis: Other abnormalities of gait and mobility (R26.89)                Time: [id number] OT Time Calculation (min):  20 min Charges:  OT General Charges $OT Visit: 1 Visit OT Evaluation $OT Eval Moderate Complexity: Brooks OTR/L Acute Rehabilitation Services Office: [place] 03/20/2021, 12:48 PM

## 2021-03-20 NOTE — Progress Notes (Signed)
[id number] Patient arrived into room 5W18 from ED via stretcher. A&O x3. VSS. Denies dizziness/vertigo symptoms as this time. Bed locked and in lowest position, non skid footwear in place, call light and personal belongings within reach. Bed alarm on.   Writer educated patient on importance of using bedside commode with staff present until syncope/vertigo symptoms are resolved. Patient verbalized understanding and agreed to adhere to plan of care.   Writer spoke with wife over the phone regarding spending the night in room with patient. Writer re-iterated hospital visitors policy and need for approval from director prior to wife spending the night. Wife verbalized understanding and agrees to plan of care.

## 2021-03-20 NOTE — Progress Notes (Addendum)
Physical Therapy Evaluation Patient Details Name: Jake Montgomery MRN: [id number] DOB: Jan 23, 1946 Today's Date: 03/20/2021  History of Present Illness  75 y.o. male presented 03/19/21 with weakness and syncope. Seen in ED with negative MRI, sent home, near syncope with dizziness/vertigo and returned to ED.   PMH significant of DM; HTN; CAD; and OSA on CPAP .  Clinical Impression   Pt admitted secondary to problem above with deficits below. PTA patient was living with wife and using a cane for ambulation due to unsteadiness s/p knee surgery several years ago. Pt currently requires minguard assist with RW for safety due to recent syncope with a fall. Pt with one brief episode of lightheadedness after walking ~30 ft that resolved in <10 seconds. He reports typically has dizziness with supine to sit and sit to stand (lightheaded and has to wait before proceeding with movement). Noted earlier orthostatic vital signs showed drop from supine to sit and  recovery with sit to stand. Nature of dizziness not consistent with vestibular disorder. Discussed with pt using his RW instead of his cane on discharge as he currently feels a bit weaker and more unsteady than usual. Anticipate patient will benefit from PT while hospitalized to address problems listed below.Will continue to follow acutely to maximize functional mobility independence and safety.          Recommendations for follow up therapy are one component of a multi-disciplinary discharge planning process, led by the attending physician.  Recommendations may be updated based on patient status, additional functional criteria and insurance authorization.  Follow Up Recommendations No PT follow up    Equipment Recommendations  None recommended by PT    Recommendations for Other Services OT consult     Precautions / Restrictions Precautions Precautions: Fall Precaution Comments: recent syncope with fall      Mobility  Bed Mobility                General bed mobility comments: up in recliner pre and post-session    Transfers Overall transfer level: Modified independent Equipment used: Rolling walker (2 wheeled)             General transfer comment: pt did not have his cane here therefore used RW; proper hand placement  Ambulation/Gait Ambulation/Gait assistance: Min guard Gait Distance (Feet): 120 Feet Assistive device: Rolling walker (2 wheeled) Gait Pattern/deviations: Step-through pattern;Decreased stride length;Trunk flexed     General Gait Details: standing rest break x1 due to lightheadedness <10 seconds and subsided  Stairs            Wheelchair Mobility    Modified Rankin (Stroke Patients Only)       Balance Overall balance assessment: Mild deficits observed, not formally tested                                           Pertinent Vitals/Pain Pain Assessment: No/denies pain    Home Living Family/patient expects to be discharged to:: Private residence Living Arrangements: Spouse/significant other Available Help at Discharge: Family;Available 24 hours/day Type of Home: House Home Access: Level entry     Home Layout: One level Home Equipment: Walker - 2 wheels;Cane - quad      Prior Function Level of Independence: Independent with assistive device(s)         Comments: uses cane since his knee surgery 3 years ago due to feeling unsteady; has  had near falls and catches himself; sometimes uses an Scientist, research (physical sciences) Dominance        Extremity/Trunk Assessment   Upper Extremity Assessment Upper Extremity Assessment: Defer to OT evaluation    Lower Extremity Assessment Lower Extremity Assessment: Overall WFL for tasks assessed    Cervical / Trunk Assessment Cervical / Trunk Assessment: Normal  Communication   Communication: No difficulties  Cognition Arousal/Alertness: Awake/alert Behavior During Therapy: WFL for tasks  assessed/performed Overall Cognitive Status: Within Functional Limits for tasks assessed                                        General Comments General comments (skin integrity, edema, etc.): pt reports his dizziness is usually lightheadedness, but occasionally feels like the earth is moving or is "flat"    Exercises     Assessment/Plan    PT Assessment Patient needs continued PT services  PT Problem List Decreased balance;Decreased mobility       PT Treatment Interventions DME instruction;Gait training;Functional mobility training;Therapeutic activities;Balance training;Patient/family education    PT Goals (Current goals can be found in the Care Plan section)  Acute Rehab PT Goals Patient Stated Goal: go home soon PT Goal Formulation: With patient Time For Goal Achievement: 04/03/21 Potential to Achieve Goals: Good    Frequency Min 2X/week   Barriers to discharge        Co-evaluation               AM-PAC PT "6 Clicks" Mobility  Outcome Measure Help needed turning from your back to your side while in a flat bed without using bedrails?: None Help needed moving from lying on your back to sitting on the side of a flat bed without using bedrails?: None Help needed moving to and from a bed to a chair (including a wheelchair)?: A Little Help needed standing up from a chair using your arms (e.g., wheelchair or bedside chair)?: A Little Help needed to walk in hospital room?: A Little Help needed climbing 3-5 steps with a railing? : A Little 6 Click Score: 20    End of Session Equipment Utilized During Treatment: Gait belt Activity Tolerance: Patient tolerated treatment well Patient left: in chair;with call bell/phone within reach (up in chair on arrival without chair alarm; a&ox4 and able to demonstrate how to call for assist) Nurse Communication: Mobility status PT Visit Diagnosis: Unsteadiness on feet (R26.81)    Time: [id number] PT Time  Calculation (min) (ACUTE ONLY): 19 min   Charges:   PT Evaluation $PT Eval Low Complexity: 1 Low           Arby Barrette, PT Pager [phone]   Rexanne Mano 03/20/2021, 9:51 AM

## 2021-03-20 NOTE — Progress Notes (Signed)
SATURATION QUALIFICATIONS: (This note is used to comply with regulatory documentation for home oxygen)  Patient Saturations on Room Air at Rest = 97%  Patient Saturations on Room Air while Ambulating = 98%    Patient does not need oxygen for activity.    Arby Barrette, PT Pager [phone]

## 2021-03-20 NOTE — Progress Notes (Signed)
PROGRESS NOTE                                                                                                                                                                                                             Patient Demographics:    Jake Montgomery, is a 75 y.o. male, DOB - 05-27-1946, [id number]  Outpatient Primary MD for the patient is Jilda Panda, MD    LOS - 0  Admit date - 03/18/2021    Chief Complaint  Patient presents with   Weakness       Brief Narrative (HPI from H&P)  Jake Montgomery is a 75 y.o. male with medical history significant of DM; HTN; CAD; and OSA on CPAP presenting with weakness.  He came in the first time because he fell and blanked out, he was admitted for syncope work-up.   Subjective:    Jake Montgomery today has, No headache, No chest pain, No abdominal pain - No Nausea, No new weakness tingling or numbness, no SOB, still dizzy.   Assessment  & Plan :     Syncope and dizziness most likely due to hypotension from multiple blood pressure medications at very high dosages, MRI brain, CT a head and neck, echocardiogram all nonacute, he is still dizzy upon sitting up and standing, supine blood pressure is low, will hydrate with IV fluids, have discontinued his ARB and cut down his Coreg to 1 hold home dose.  Advance activity and monitor.  He is morbidly obese and lives with an elderly wife, not safe for discharge currently with ongoing dizziness and high fall risk.  2. Morbid obesity.  BMI 39.  Follow with PCP for weight loss.  Continue CPAP nightly.  3.  Reported gross hematuria - UA notes, stable [place], hold ASA, outpt Urology follow up.  4. CAD -no acute issues, echocardiogram stable, currently treated with beta-blocker and statin for secondary prevention, resume aspirin from tomorrow as he mentioned he had strokes.  5.DM2  - continue Lantus and sliding scale.  Lab Results  Component  Value Date   HGBA1C 8.1 (H) 03/19/2021    CBG (last 3)  Recent Labs    03/18/21 1131 03/19/21 1714 03/20/21 0730  GLUCAP 196* 163* 162*          Condition - Fair  Family Communication  :  None present  Code Status :  Full  Consults  :  None  PUD Prophylaxis : None   Procedures  :     TTE -  1. Left ventricular ejection fraction, by estimation, is 60 to 65%. The left ventricle has normal function. The left ventricle has no regional wall motion abnormalities. Left ventricular diastolic parameters are consistent with Grade I diastolic dysfunction (impaired relaxation).  2. Right ventricular systolic function is normal. The right ventricular size is normal.  3. The mitral valve is normal in structure. No evidence of mitral valve regurgitation. No evidence of mitral stenosis.  4. The aortic valve is tricuspid. Aortic valve regurgitation is not visualized. No aortic stenosis is present.  5. There is mild dilatation of the ascending aorta, measuring 42 mm.  6. The inferior vena cava is normal in size with greater than 50% respiratory variability, suggesting right atrial pressure of 3 mmHg.   MRI Brain, CTA Head and Neck - Non acute.      Disposition Plan  :    Status is: Observation  Dispo: The patient is from: Home              Anticipated d/c is to: Home              Patient currently is not medically stable to d/c.   Difficult to place patient No  DVT Prophylaxis  :    enoxaparin (LOVENOX) injection 40 mg Start: 03/19/21 1600    Lab Results  Component Value Date   PLT 217 03/20/2021    Diet :  Diet Order             Diet Carb Modified Fluid consistency: Thin; Room service appropriate? Yes  Diet effective now                    Inpatient Medications  Scheduled Meds:  carvedilol  3.125 mg Oral BID WC   enoxaparin (LOVENOX) injection  40 mg Subcutaneous Q24H   insulin aspart  0-15 Units Subcutaneous TID WC   insulin glargine-yfgn  40 Units  Subcutaneous Daily   pravastatin  20 mg Oral QHS   sodium chloride flush  3 mL Intravenous Q12H   Continuous Infusions:  lactated ringers     PRN Meds:.acetaminophen **OR** acetaminophen, [DISCONTINUED] ondansetron **OR** ondansetron (ZOFRAN) IV  Antibiotics  :    Anti-infectives (From admission, onward)    None        Time Spent in minutes  30   Lala Lund M.D on 03/20/2021 at 11:27 AM  To page go to [email or web address]   [facility] -  Office  [phone]    See all Orders from today for further details    Objective:   Vitals:   03/20/21 0510 03/20/21 0727 03/20/21 0747 03/20/21 0815  BP: 120/84 103/65  113/64  Pulse: (!) 101 93 93 94  Resp: '20 18  16  '$ Temp:  99.5 F (37.5 C)    TempSrc:  Oral    SpO2: 94% 95% 96% 96%  Weight: 117.4 kg     Height:        Wt Readings from Last 3 Encounters:  03/20/21 117.4 kg  03/18/21 121.6 kg  05/25/17 109.3 kg     Intake/Output Summary (Last 24 hours) at 03/20/2021 1127 Last data filed at 03/20/2021 1042 Gross per 24 hour  Intake 983 ml  Output 300 ml  Net 683 ml     Physical Exam  Awake Alert,  No new F.N deficits, Normal affect [place].AT,PERRAL Supple Neck,No JVD, No cervical lymphadenopathy appriciated.  Symmetrical Chest wall movement, Good air movement bilaterally, CTAB RRR,No Gallops,Rubs or new Murmurs, No Parasternal Heave +ve B.Sounds, Abd Soft, No tenderness, No organomegaly appriciated, No rebound - guarding or rigidity. No Cyanosis, Clubbing or edema, No new Rash or bruise      Data Review:    CBC Recent Labs  Lab 03/18/21 1138 03/19/21 0849 03/20/21 0135  WBC 5.4 6.7 8.6  HGB 14.5 14.7 12.8*  HCT 42.9 45.1 38.6*  PLT 267 248 217  MCV 89.0 90.6 88.1  MCH 30.1 29.5 29.2  MCHC 33.8 32.6 33.2  RDW 13.7 13.8 13.8  LYMPHSABS 1.7 1.2  --   MONOABS 0.6 0.7  --   EOSABS 0.1 0.1  --   BASOSABS 0.1 0.1  --     Recent Labs  Lab 03/18/21 1138 03/19/21 0849 03/19/21 1138  03/20/21 0135  NA 132* 134*  --  130*  K 4.2 4.3  --  4.2  CL 101 101  --  101  CO2 25 26  --  22  GLUCOSE 209* 213*  --  167*  BUN 9 9  --  14  CREATININE 0.93 1.04  --  1.12  CALCIUM 8.7* 9.1  --  8.5*  AST 20  --   --   --   ALT 23  --   --   --   ALKPHOS 78  --   --   --   BILITOT 0.6  --   --   --   ALBUMIN 3.7  --   --   --   INR 1.1  --   --   --   TSH  --   --  0.698  --   HGBA1C  --  8.1*  --   --     ------------------------------------------------------------------------------------------------------------------ No results for input(s): CHOL, HDL, LDLCALC, TRIG, CHOLHDL, LDLDIRECT in the last 72 hours.  Lab Results  Component Value Date   HGBA1C 8.1 (H) 03/19/2021   ------------------------------------------------------------------------------------------------------------------ Recent Labs    03/19/21 1138  TSH 0.698    Cardiac Enzymes No results for input(s): CKMB, TROPONINI, MYOGLOBIN in the last 168 hours.  Invalid input(s): CK ------------------------------------------------------------------------------------------------------------------ No results found for: BNP    Radiology Reports MR Brain Wo Contrast (neuro protocol)  Result Date: 03/18/2021 CLINICAL DATA:  Stroke suspected EXAM: MRI HEAD WITHOUT CONTRAST TECHNIQUE: Multiplanar, multiecho pulse sequences of the brain and surrounding structures were obtained without intravenous contrast. COMPARISON:  No prior MRI of the head available. Correlation is made with same day CT head FINDINGS: Evaluation is limited by motion artifact. Brain: No acute infarction, hemorrhage, hydrocephalus, extra-axial collection, or mass lesion. The ventricles and sulci are within normal limits for age. T2 hyperintense signal in the periventricular white matter, likely the sequela of chronic small vessel ischemic disease. No foci of susceptibility to suggest remote hemorrhage. Vascular: Normal flow voids. Skull and upper  cervical spine: Normal marrow signal. Sinuses/Orbits: Status post right lens replacement. Otherwise negative. Other: The mastoids are well aerated. IMPRESSION: No acute intracranial process. Electronically Signed   By: Merilyn Baba M.D.   On: 03/18/2021 19:30   US RENAL  Result Date: 03/19/2021 CLINICAL DATA:  Hematuria. EXAM: RENAL / URINARY TRACT ULTRASOUND COMPLETE COMPARISON:  CT abdomen and pelvis 08/29/2018 FINDINGS: Image quality is degraded by patient body habitus. Right Kidney: Renal measurements: 9.9 x 5.8 x 5.4 cm = volume: 162 mL.  Echogenicity within normal limits. No mass or hydronephrosis visualized. Left Kidney: Renal measurements: 12.1 x 6.3 x 5.2 cm = volume: 210 mL. Echogenicity within normal limits. No mass or hydronephrosis visualized. Bladder: Appears normal for degree of bladder distention. Other: None. IMPRESSION: Negative renal ultrasound. Electronically Signed   By: Logan Bores M.D.   On: 03/19/2021 13:28   ECHOCARDIOGRAM COMPLETE  Result Date: 03/19/2021    ECHOCARDIOGRAM REPORT   Patient Name:   ELIGAH MARCINKO [place] Date of Exam: 03/19/2021 Medical Rec #:  [id number]      Height:       68.0 in Accession #:    [id number]     Weight:       268.0 lb Date of Birth:  December 06, 1945     BSA:          2.314 m Patient Age:    60 years       BP:           132/80 mmHg Patient Gender: M              HR:           82 bpm. Exam Location:  Inpatient Procedure: 2D Echo, Cardiac Doppler and Color Doppler Indications:    R55 Syncope  History:        Patient has no prior history of Echocardiogram examinations.                 Syncope.  Sonographer:    [place] Referring Phys: [place]  1. Left ventricular ejection fraction, by estimation, is 60 to 65%. The left ventricle has normal function. The left ventricle has no regional wall motion abnormalities. Left ventricular diastolic parameters are consistent with Grade I diastolic dysfunction (impaired relaxation).  2. Right ventricular systolic  function is normal. The right ventricular size is normal.  3. The mitral valve is normal in structure. No evidence of mitral valve regurgitation. No evidence of mitral stenosis.  4. The aortic valve is tricuspid. Aortic valve regurgitation is not visualized. No aortic stenosis is present.  5. There is mild dilatation of the ascending aorta, measuring 42 mm.  6. The inferior vena cava is normal in size with greater than 50% respiratory variability, suggesting right atrial pressure of 3 mmHg. FINDINGS  Left Ventricle: Left ventricular ejection fraction, by estimation, is 60 to 65%. The left ventricle has normal function. The left ventricle has no regional wall motion abnormalities. The left ventricular internal cavity size was normal in size. There is  no left ventricular hypertrophy. Left ventricular diastolic parameters are consistent with Grade I diastolic dysfunction (impaired relaxation). Right Ventricle: The right ventricular size is normal. No increase in right ventricular wall thickness. Right ventricular systolic function is normal. Left Atrium: Left atrial size was normal in size. Right Atrium: Right atrial size was normal in size. Pericardium: There is no evidence of pericardial effusion. Mitral Valve: The mitral valve is normal in structure. No evidence of mitral valve regurgitation. No evidence of mitral valve stenosis. Tricuspid Valve: The tricuspid valve is normal in structure. Tricuspid valve regurgitation is not demonstrated. No evidence of tricuspid stenosis. Aortic Valve: The aortic valve is tricuspid. Aortic valve regurgitation is not visualized. No aortic stenosis is present. Aortic valve mean gradient measures 3.0 mmHg. Aortic valve peak gradient measures 5.1 mmHg. Aortic valve area, by VTI measures 2.43 cm. Pulmonic Valve: The pulmonic valve was normal in structure. Pulmonic valve regurgitation is not visualized. No evidence of pulmonic stenosis.  Aorta: The aortic root is normal in size and  structure. There is mild dilatation of the ascending aorta, measuring 42 mm. Venous: The inferior vena cava was not well visualized. The inferior vena cava is normal in size with greater than 50% respiratory variability, suggesting right atrial pressure of 3 mmHg. IAS/Shunts: No atrial level shunt detected by color flow Doppler.  LEFT VENTRICLE PLAX 2D LVIDd:         4.00 cm     Diastology LVIDs:         2.70 cm     LV e' medial:    5.66 cm/s LV PW:         0.80 cm     LV E/e' medial:  12.7 LV IVS:        0.99 cm     LV e' lateral:   9.25 cm/s LVOT diam:     2.20 cm     LV E/e' lateral: 7.7 LV SV:         58 LV SV Index:   25 LVOT Area:     3.80 cm  LV Volumes (MOD) LV vol d, MOD A4C: 38.0 ml LV vol s, MOD A4C: 11.2 ml LV SV MOD A4C:     38.0 ml RIGHT VENTRICLE RV S prime:     10.40 cm/s TAPSE (M-mode): 2.1 cm LEFT ATRIUM             Index      RIGHT ATRIUM          Index LA diam:        2.70 cm 1.17 cm/m RA Area:     6.56 cm LA Vol (A2C):   14.2 ml 6.14 ml/m RA Volume:   8.77 ml  3.79 ml/m LA Vol (A4C):   10.2 ml 4.41 ml/m LA Biplane Vol: 12.4 ml 5.36 ml/m  AORTIC VALVE                   PULMONIC VALVE AV Area (Vmax):    2.88 cm    PV Vmax:       0.54 m/s AV Area (Vmean):   2.59 cm    PV Peak grad:  1.1 mmHg AV Area (VTI):     2.43 cm AV Vmax:           113.00 cm/s AV Vmean:          80.900 cm/s AV VTI:            0.239 m AV Peak Grad:      5.1 mmHg AV Mean Grad:      3.0 mmHg LVOT Vmax:         85.50 cm/s LVOT Vmean:        55.200 cm/s LVOT VTI:          0.153 m LVOT/AV VTI ratio: 0.64  AORTA Ao Root diam: 3.40 cm Ao Asc diam:  4.20 cm MITRAL VALVE MV Area (PHT): 2.75 cm    SHUNTS MV E velocity: 71.60 cm/s  Systemic VTI:  0.15 m MV A velocity: 93.80 cm/s  Systemic Diam: 2.20 cm MV E/A ratio:  0.76 Skeet Latch MD Electronically signed by Skeet Latch MD Signature Date/Time: 03/19/2021/4:57:01 PM    Final    CT HEAD CODE STROKE WO CONTRAST  Result Date: 03/18/2021 CLINICAL DATA:  Code stroke.  EXAM: CT HEAD WITHOUT CONTRAST TECHNIQUE: Contiguous axial images were obtained from the base of the skull through the vertex without intravenous contrast.  COMPARISON:  None available. FINDINGS: Brain: No evidence of acute infarction, hemorrhage, cerebral edema, mass, mass effect, or midline shift. Ventricles and sulci are normal for age. No extra-axial fluid collection. Periventricular white matter changes, likely the sequela of chronic small vessel ischemic disease. Vascular: No hyperdense vessel or unexpected calcification. Skull: Normal. Negative for fracture or focal lesion. Sinuses/Orbits: No acute finding. Other: The mastoid air cells are well aerated. ASPECTS [facility] Stroke Program Early CT Score) - Ganglionic level infarction (caudate, lentiform nuclei, internal capsule, insula, M1-M3 cortex): 7 - Supraganglionic infarction (M4-M6 cortex): 3 Total score (0-10 with 10 being normal): 10 IMPRESSION: 1. No acute intracranial process. 2. ASPECTS is 10 Code stroke imaging results were communicated on 03/18/2021 at 8:57 am to provider Dr. Dina Rich Via telephone, who verbally acknowledged these results. Electronically Signed   By: Merilyn Baba M.D.   On: 03/18/2021 11:58   CT ANGIO HEAD NECK W WO CM (CODE STROKE)  Result Date: 03/18/2021 CLINICAL DATA:  Stroke code, syncope when bending over EXAM: CT ANGIOGRAPHY HEAD AND NECK TECHNIQUE: Multidetector CT imaging of the head and neck was performed using the standard protocol during bolus administration of intravenous contrast. Multiplanar CT image reconstructions and MIPs were obtained to evaluate the vascular anatomy. Carotid stenosis measurements (when applicable) are obtained utilizing NASCET criteria, using the distal internal carotid diameter as the denominator. CONTRAST:  124m OMNIPAQUE IOHEXOL 350 MG/ML SOLN COMPARISON:  No prior CTA. FINDINGS: CT HEAD FINDINGS Please see same day CT brain stroke code. CTA NECK FINDINGS Aortic arch: Two-vessel arch with  common origin of the left common carotid and brachiocephalic arteries. Imaged portion shows no evidence of aneurysm or dissection. No significant stenosis of the major arch vessel origins. Right carotid system: No evidence of dissection, stenosis (50% or greater) or occlusion. Left carotid system: No evidence of dissection, stenosis (50% or greater) or occlusion. Vertebral arteries: Codominant. No evidence of dissection, stenosis (50% or greater) or occlusion. Skeleton: No acute osseous abnormality. Other neck: Negative Upper chest: No focal pulmonary opacity. Bibasilar atelectasis. No pleural effusion. Review of the MIP images confirms the above findings CTA HEAD FINDINGS Anterior circulation: Both internal carotid arteries are widely patent to the termini, without stenosis or other abnormality. A1 segments patent. Normal anterior communicating artery. Anterior cerebral arteries are widely patent to their distal aspects. No M1 stenosis or occlusion. Normal MCA bifurcations. Distal MCA branches well perfused and symmetric. Posterior circulation: Vertebral arteries widely patent to the vertebrobasilar junction without stenosis. Posterior inferior cerebral arteries patent bilaterally. Basilar patent to its distal aspect. Superior cerebral arteries patent bilaterally. PCAs well perfused to their distal aspects without stenosis. Venous sinuses: As permitted by contrast timing, patent. Anatomic variants: Posterior communicating arteries are not visualized. Review of the MIP images confirms the above findings IMPRESSION: No large vessel occlusion or hemodynamically significant stenosis. Electronically Signed   By: AMerilyn BabaM.D.   On: 03/18/2021 12:30

## 2021-03-20 NOTE — Progress Notes (Signed)
   03/20/21 1635  Vitals  BP (!) 115/59  MAP (mmHg) 77  BP Location Left Arm  BP Method Automatic  Patient Position (if appropriate) Sitting  Pulse Rate 85  Pulse Rate Source Monitor  ECG Heart Rate 85  Resp 17  MEWS COLOR  MEWS Score Color Green  Oxygen Therapy  SpO2 98 %  MEWS Score  MEWS Temp 0  MEWS Systolic 0  MEWS Pulse 0  MEWS RR 0  MEWS LOC 0  MEWS Score 0   Patient due to 3.125 mg of Coreg, notified Dr. Candiss Norse of BP, notified to hold 1700 dose. See [facility]

## 2021-03-20 NOTE — TOC Initial Note (Addendum)
Transition of Care [facility]) - Initial/Assessment Note    Patient Details  Name: Jake Montgomery MRN: [id number] Date of Birth: July 26, 1945  Transition of Care [facility]) CM/SW Contact:    Verdell Carmine, RN Phone Number: 03/20/2021, 9:00 AM  Clinical Narrative:                  75 year old admitted with syncope, MRI negative, was seen in ED and DC, returned for continuing symptoms and hematuria within 71 H., lives at home with wife. Has daughter to cal on. Has DME at [facility] and scooter when in stores etc.   PT consulted for recommendations.Most likely  needs HH. Is deconditioned.  CM will follow for needs, recommendations, and transitions.   1100 PT evaluation, no HH needed. No DME recommended. , oxygen qualifications rule out oxygen need at home.   Expected Discharge Plan: [place] Barriers to Discharge: Continued Medical Work up   Patient Goals and CMS Choice    Home self care    Expected Discharge Plan and Services Expected Discharge Plan: [place]   Discharge Planning Services: CM Consult   Living arrangements for the past 2 months: Single Family Home                                      Prior Living Arrangements/Services Living arrangements for the past 2 months: Single Family Home Lives with:: Spouse Patient language and need for interpreter reviewed:: Yes        Need for Family Participation in Patient Care: Yes (Comment) Care giver support system in place?: Yes (comment)   Criminal Activity/Legal Involvement Pertinent to Current Situation/Hospitalization: No - Comment as needed  Activities of Daily Living Home Assistive Devices/Equipment: Cane (specify quad or straight) (straight) ADL Screening (condition at time of admission) Patient's cognitive ability adequate to safely complete daily activities?: Yes Is the patient deaf or have difficulty hearing?: No Does the patient have difficulty seeing, even when wearing  glasses/contacts?: No Does the patient have difficulty concentrating, remembering, or making decisions?: No Patient able to express need for assistance with ADLs?: No Does the patient have difficulty dressing or bathing?: No Independently performs ADLs?: Yes (appropriate for developmental age) Does the patient have difficulty walking or climbing stairs?: Yes Weakness of Legs: Both Weakness of Arms/Hands: None  Permission Sought/Granted                  Emotional Assessment       Orientation: : Oriented to Self, Oriented to Place, Oriented to  Time, Oriented to Situation Alcohol / Substance Use: Not Applicable    Admission diagnosis:  Syncope and collapse [R55] Gross hematuria [R31.0] Generalized weakness [R53.1] Near syncope [R55] Hematuria [R31.9] Patient Active Problem List   Diagnosis Date Noted   Near syncope 03/19/2021   Gross hematuria 03/19/2021   Diabetes mellitus type 2 in obese ([place]) 03/19/2021   Essential hypertension 03/19/2021   OSA on CPAP 03/19/2021   Obesity, Class III, BMI 40-49.9 (morbid obesity) ([place]) 03/19/2021   PCP:  Jilda Panda, MD Pharmacy:   [place], [place] - [address] [place] [address] Phone: [phone] Fax: [phone]     Social Determinants of Health (SDOH) Interventions    Readmission Risk Interventions No flowsheet data found.

## 2021-03-20 NOTE — Progress Notes (Signed)
Pt refused cpap for tonight 

## 2021-03-20 NOTE — Progress Notes (Signed)
RT note. Patient refuse cpap tonight. Told patient if he changes his mind to let his RN know and we will bring a cpap up for patient. Patient currently on rm air sat 97% with no labored breathing. RT will continue to monitor.

## 2021-03-21 ENCOUNTER — Inpatient Hospital Stay (HOSPITAL_COMMUNITY): Payer: Medicare (Managed Care)

## 2021-03-21 DIAGNOSIS — R55 Syncope and collapse: Secondary | ICD-10-CM | POA: Diagnosis not present

## 2021-03-21 LAB — GLUCOSE, CAPILLARY
Glucose-Capillary: 196 mg/dL — ABNORMAL HIGH (ref 70–99)
Glucose-Capillary: 116 mg/dL — ABNORMAL HIGH (ref 70–99)
Glucose-Capillary: 136 mg/dL — ABNORMAL HIGH (ref 70–99)
Glucose-Capillary: 183 mg/dL — ABNORMAL HIGH (ref 70–99)
Glucose-Capillary: 135 mg/dL — ABNORMAL HIGH (ref 70–99)
Glucose-Capillary: 152 mg/dL — ABNORMAL HIGH (ref 70–99)

## 2021-03-21 LAB — COMPREHENSIVE METABOLIC PANEL
ALT: 19 U/L (ref 0–44)
AST: 24 U/L (ref 15–41)
Albumin: 2.9 g/dL — ABNORMAL LOW (ref 3.5–5.0)
Alkaline Phosphatase: 58 U/L (ref 38–126)
Anion gap: 7 (ref 5–15)
BUN: 12 mg/dL (ref 8–23)
CO2: 25 mmol/L (ref 22–32)
Calcium: 8.5 mg/dL — ABNORMAL LOW (ref 8.9–10.3)
Chloride: 102 mmol/L (ref 98–111)
Creatinine, Ser: 1.01 mg/dL (ref 0.61–1.24)
GFR, Estimated: >60 mL/min (ref >60)
Glucose, Bld: 125 mg/dL — ABNORMAL HIGH (ref 70–99)
Potassium: 4.1 mmol/L (ref 3.5–5.1)
Sodium: 134 mmol/L — ABNORMAL LOW (ref 135–145)
Total Bilirubin: 0.9 mg/dL (ref 0.3–1.2)
Total Protein: 6.3 g/dL — ABNORMAL LOW (ref 6.5–8.1)

## 2021-03-21 LAB — CBC WITH DIFFERENTIAL/PLATELET
Abs Immature Granulocytes: 0.02 10*3/uL (ref 0.00–0.07)
Basophils Absolute: 0.1 10*3/uL (ref 0.0–0.1)
Basophils Relative: 1 %
Eosinophils Absolute: 0.2 10*3/uL (ref 0.0–0.5)
Eosinophils Relative: 3 %
HCT: 40 % (ref 39.0–52.0)
Hemoglobin: 13.1 g/dL (ref 13.0–17.0)
Immature Granulocytes: 0 %
Lymphocytes Relative: 29 %
Lymphs Abs: 1.6 10*3/uL (ref 0.7–4.0)
MCH: 29.8 pg (ref 26.0–34.0)
MCHC: 32.8 g/dL (ref 30.0–36.0)
MCV: 91.1 fL (ref 80.0–100.0)
Monocytes Absolute: 1.2 10*3/uL — ABNORMAL HIGH (ref 0.1–1.0)
Monocytes Relative: 23 %
Neutro Abs: 2.4 10*3/uL (ref 1.7–7.7)
Neutrophils Relative %: 44 %
Platelets: 177 10*3/uL (ref 150–400)
RBC: 4.39 MIL/uL (ref 4.22–5.81)
RDW: 13.7 % (ref 11.5–15.5)
WBC: 5.4 10*3/uL (ref 4.0–10.5)
nRBC: 0 % (ref 0.0–0.2)

## 2021-03-21 LAB — URINALYSIS, ROUTINE W REFLEX MICROSCOPIC
Bilirubin Urine: NEGATIVE
Glucose, UA: >=500 mg/dL — AB
Ketones, ur: 5 mg/dL — AB
Leukocytes,Ua: NEGATIVE
Nitrite: NEGATIVE
Protein, ur: NEGATIVE mg/dL
Specific Gravity, Urine: 1.02 (ref 1.005–1.030)
pH: 5 (ref 5.0–8.0)

## 2021-03-21 LAB — PROCALCITONIN: Procalcitonin: 0.67 ng/mL

## 2021-03-21 LAB — MAGNESIUM: Magnesium: 2.2 mg/dL (ref 1.7–2.4)

## 2021-03-21 LAB — BRAIN NATRIURETIC PEPTIDE: B Natriuretic Peptide: 48.4 pg/mL (ref 0.0–100.0)

## 2021-03-21 MED ORDER — LACTATED RINGERS IV SOLN: INTRAVENOUS | Status: DC

## 2021-03-21 MED ORDER — SODIUM CHLORIDE 0.9 % IV SOLN
1.0000 g | INTRAVENOUS | Status: AC
  Administered 2021-03-21 – 2021-03-23 (×3): 1 g via INTRAVENOUS
  Filled 2021-03-21 (×3): qty 10

## 2021-03-21 MED ORDER — LACTATED RINGERS IV BOLUS
500.0000 mL | Freq: Once | INTRAVENOUS | Status: AC
  Administered 2021-03-21: 500 mL via INTRAVENOUS

## 2021-03-21 NOTE — Progress Notes (Signed)
PROGRESS NOTE                                                                                                                                                                                                             Patient Demographics:    Urho Mandel, is a 75 y.o. male, DOB - [birth year], [id number]  Outpatient Primary MD for the patient is Jilda Panda, MD    LOS - 1  Admit date - 03/18/2021    Chief Complaint  Patient presents with   Weakness       Brief Narrative (HPI from H&P)  CLINT GRONEMEYER is a 75 y.o. male with medical history significant of DM; HTN; CAD; and OSA on CPAP presenting with weakness.  He came in the first time because he fell and blanked out, he was admitted for syncope work-up.   Subjective:   Patient in bed, appears comfortable, denies any headache, no fever, no chest pain or pressure, no shortness of breath , no abdominal pain. No new focal weakness.    Assessment  & Plan :     Syncope and dizziness most likely due to hypotension from multiple blood pressure medications at very high dosages, MRI brain, CT a head and neck, echocardiogram all nonacute, he is still dizzy upon sitting up and standing, supine blood pressure is low, will hydrate with IV fluids, have discontinued his ARB and cut down his Coreg monitor.  Advance activity and monitor.  He is morbidly obese and lives with an elderly wife, not safe for discharge currently with ongoing dizziness and high fall risk.  2. Morbid obesity.  BMI 39.  Follow with PCP for weight loss.  Continue CPAP nightly.  3.  Reported gross hematuria - UA notes, stable [place], hold ASA, outpt Urology follow up.  4. CAD -no acute issues, echocardiogram stable, currently treated with beta-blocker and statin for secondary prevention, resume aspirin from tomorrow as he mentioned he had strokes.  5.DM2  - continue Lantus and sliding scale.  Lab Results   Component Value Date   HGBA1C 8.1 (H) 03/19/2021    CBG (last 3)  Recent Labs    03/20/21 1610 03/20/21 2030 03/21/21 0804  GLUCAP 210* 123* 116*          Condition - Fair  Family Communication  :  None present  Code Status :  Full  Consults  :  None  PUD Prophylaxis : None   Procedures  :     TTE -  1. Left ventricular ejection fraction, by estimation, is 60 to 65%. The left ventricle has normal function. The left ventricle has no regional wall motion abnormalities. Left ventricular diastolic parameters are consistent with Grade I diastolic dysfunction (impaired relaxation).  2. Right ventricular systolic function is normal. The right ventricular size is normal.  3. The mitral valve is normal in structure. No evidence of mitral valve regurgitation. No evidence of mitral stenosis.  4. The aortic valve is tricuspid. Aortic valve regurgitation is not visualized. No aortic stenosis is present.  5. There is mild dilatation of the ascending aorta, measuring 42 mm.  6. The inferior vena cava is normal in size with greater than 50% respiratory variability, suggesting right atrial pressure of 3 mmHg.   MRI Brain, CTA Head and Neck - Non acute.      Disposition Plan  :    Status is: Observation  Dispo: The patient is from: Home              Anticipated d/c is to: Home              Patient currently is not medically stable to d/c.   Difficult to place patient No  DVT Prophylaxis  :    enoxaparin (LOVENOX) injection 40 mg Start: 03/19/21 1600    Lab Results  Component Value Date   PLT 177 03/21/2021    Diet :  Diet Order             Diet Carb Modified Fluid consistency: Thin; Room service appropriate? Yes  Diet effective now                    Inpatient Medications  Scheduled Meds:  carvedilol  3.125 mg Oral BID WC   enoxaparin (LOVENOX) injection  40 mg Subcutaneous Q24H   insulin aspart  0-15 Units Subcutaneous TID WC   insulin glargine-yfgn  40  Units Subcutaneous Daily   pravastatin  20 mg Oral QHS   sodium chloride flush  3 mL Intravenous Q12H   Continuous Infusions:  lactated ringers 50 mL/hr at 03/21/21 0815   PRN Meds:.acetaminophen **OR** acetaminophen, [DISCONTINUED] ondansetron **OR** ondansetron (ZOFRAN) IV  Antibiotics  :    Anti-infectives (From admission, onward)    None        Time Spent in minutes  30   Lala Lund M.D on 03/21/2021 at 11:30 AM  To page go to [email or web address]   [facility] -  Office  [phone]    See all Orders from today for further details    Objective:   Vitals:   03/21/21 0400 03/21/21 0436 03/21/21 0800 03/21/21 0938  BP: (!) 153/76   (!) 147/79  Pulse: 76   91  Resp: 18  19   Temp: 97.8 F (36.6 C)  99 F (37.2 C)   TempSrc: Oral  Oral   SpO2: 97%     Weight:  120 kg    Height:        Wt Readings from Last 3 Encounters:  03/21/21 120 kg  03/18/21 121.6 kg  05/25/17 109.3 kg     Intake/Output Summary (Last 24 hours) at 03/21/2021 1130 Last data filed at 03/21/2021 0820 Gross per 24 hour  Intake 2437.87 ml  Output 2525 ml  Net -87.13 ml     Physical  Exam  Awake Alert, No new F.N deficits, Normal affect [place].AT,PERRAL Supple Neck,No JVD, No cervical lymphadenopathy appriciated.  Symmetrical Chest wall movement, Good air movement bilaterally, CTAB RRR,No Gallops, Rubs or new Murmurs, No Parasternal Heave +ve B.Sounds, Abd Soft, No tenderness, No organomegaly appriciated, No rebound - guarding or rigidity. No Cyanosis, Clubbing or edema, No new Rash or bruise     Data Review:    CBC Recent Labs  Lab 03/18/21 1138 03/19/21 0849 03/20/21 0135 03/21/21 0058  WBC 5.4 6.7 8.6 5.4  HGB 14.5 14.7 12.8* 13.1  HCT 42.9 45.1 38.6* 40.0  PLT 267 248 217 177  MCV 89.0 90.6 88.1 91.1  MCH 30.1 29.5 29.2 29.8  MCHC 33.8 32.6 33.2 32.8  RDW 13.7 13.8 13.8 13.7  LYMPHSABS 1.7 1.2  --  1.6  MONOABS 0.6 0.7  --  1.2*  EOSABS 0.1 0.1  --  0.2   BASOSABS 0.1 0.1  --  0.1    Recent Labs  Lab 03/18/21 1138 03/19/21 0849 03/19/21 1138 03/20/21 0135 03/21/21 0058 03/21/21 0349  NA 132* 134*  --  130*  --  134*  K 4.2 4.3  --  4.2  --  4.1  CL 101 101  --  101  --  102  CO2 25 26  --  22  --  25  GLUCOSE 209* 213*  --  167*  --  125*  BUN 9 9  --  14  --  12  CREATININE 0.93 1.04  --  1.12  --  1.01  CALCIUM 8.7* 9.1  --  8.5*  --  8.5*  AST 20  --   --   --   --  24  ALT 23  --   --   --   --  19  ALKPHOS 78  --   --   --   --  58  BILITOT 0.6  --   --   --   --  0.9  ALBUMIN 3.7  --   --   --   --  2.9*  MG  --   --   --   --   --  2.2  PROCALCITON  --   --   --  0.88  --  0.67  INR 1.1  --   --   --   --   --   TSH  --   --  0.698  --   --   --   HGBA1C  --  8.1*  --   --   --   --   BNP  --   --   --   --  48.4  --     ------------------------------------------------------------------------------------------------------------------ No results for input(s): CHOL, HDL, LDLCALC, TRIG, CHOLHDL, LDLDIRECT in the last 72 hours.  Lab Results  Component Value Date   HGBA1C 8.1 (H) 03/19/2021   ------------------------------------------------------------------------------------------------------------------ Recent Labs    03/19/21 1138  TSH 0.698    Cardiac Enzymes No results for input(s): CKMB, TROPONINI, MYOGLOBIN in the last 168 hours.  Invalid input(s): CK ------------------------------------------------------------------------------------------------------------------    Component Value Date/Time   BNP 48.4 03/21/2021 0058      Radiology Reports MR Brain Wo Contrast (neuro protocol)  Result Date: 03/18/2021 CLINICAL DATA:  Stroke suspected EXAM: MRI HEAD WITHOUT CONTRAST TECHNIQUE: Multiplanar, multiecho pulse sequences of the brain and surrounding structures were obtained without intravenous contrast. COMPARISON:  No prior MRI of the head available. Correlation is made  with same day CT head  FINDINGS: Evaluation is limited by motion artifact. Brain: No acute infarction, hemorrhage, hydrocephalus, extra-axial collection, or mass lesion. The ventricles and sulci are within normal limits for age. T2 hyperintense signal in the periventricular white matter, likely the sequela of chronic small vessel ischemic disease. No foci of susceptibility to suggest remote hemorrhage. Vascular: Normal flow voids. Skull and upper cervical spine: Normal marrow signal. Sinuses/Orbits: Status post right lens replacement. Otherwise negative. Other: The mastoids are well aerated. IMPRESSION: No acute intracranial process. Electronically Signed   By: Merilyn Baba M.D.   On: 03/18/2021 19:30   US RENAL  Result Date: 03/19/2021 CLINICAL DATA:  Hematuria. EXAM: RENAL / URINARY TRACT ULTRASOUND COMPLETE COMPARISON:  CT abdomen and pelvis 08/29/2018 FINDINGS: Image quality is degraded by patient body habitus. Right Kidney: Renal measurements: 9.9 x 5.8 x 5.4 cm = volume: 162 mL. Echogenicity within normal limits. No mass or hydronephrosis visualized. Left Kidney: Renal measurements: 12.1 x 6.3 x 5.2 cm = volume: 210 mL. Echogenicity within normal limits. No mass or hydronephrosis visualized. Bladder: Appears normal for degree of bladder distention. Other: None. IMPRESSION: Negative renal ultrasound. Electronically Signed   By: Logan Bores M.D.   On: 03/19/2021 13:28   DG Chest Port 1 View  Result Date: 03/21/2021 CLINICAL DATA:  Shortness of breath and weakness. EXAM: PORTABLE CHEST 1 VIEW COMPARISON:  CT chest dated February 10, 2021. Chest x-ray dated December 30, 2020. FINDINGS: The heart size and mediastinal contours are within normal limits. Both lungs are clear. The visualized skeletal structures are unremarkable. IMPRESSION: No active disease. Electronically Signed   By: Titus Dubin M.D.   On: 03/21/2021 08:33   ECHOCARDIOGRAM COMPLETE  Result Date: 03/19/2021    ECHOCARDIOGRAM REPORT   Patient Name:   Jake Montgomery  [place] Date of Exam: 03/19/2021 Medical Rec #:  [id number]      Height:       68.0 in Accession #:    [id number]     Weight:       268.0 lb Date of Birth:  1945-09-18     BSA:          2.314 m Patient Age:    77 years       BP:           132/80 mmHg Patient Gender: M              HR:           82 bpm. Exam Location:  Inpatient Procedure: 2D Echo, Cardiac Doppler and Color Doppler Indications:    R55 Syncope  History:        Patient has no prior history of Echocardiogram examinations.                 Syncope.  Sonographer:    [place] Referring Phys: [facility]  1. Left ventricular ejection fraction, by estimation, is 60 to 65%. The left ventricle has normal function. The left ventricle has no regional wall motion abnormalities. Left ventricular diastolic parameters are consistent with Grade I diastolic dysfunction (impaired relaxation).  2. Right ventricular systolic function is normal. The right ventricular size is normal.  3. The mitral valve is normal in structure. No evidence of mitral valve regurgitation. No evidence of mitral stenosis.  4. The aortic valve is tricuspid. Aortic valve regurgitation is not visualized. No aortic stenosis is present.  5. There is mild dilatation of the ascending aorta, measuring 42 mm.  6. The inferior vena cava is normal in size with greater than 50% respiratory variability, suggesting right atrial pressure of 3 mmHg. FINDINGS  Left Ventricle: Left ventricular ejection fraction, by estimation, is 60 to 65%. The left ventricle has normal function. The left ventricle has no regional wall motion abnormalities. The left ventricular internal cavity size was normal in size. There is  no left ventricular hypertrophy. Left ventricular diastolic parameters are consistent with Grade I diastolic dysfunction (impaired relaxation). Right Ventricle: The right ventricular size is normal. No increase in right ventricular wall thickness. Right ventricular systolic function is normal.  Left Atrium: Left atrial size was normal in size. Right Atrium: Right atrial size was normal in size. Pericardium: There is no evidence of pericardial effusion. Mitral Valve: The mitral valve is normal in structure. No evidence of mitral valve regurgitation. No evidence of mitral valve stenosis. Tricuspid Valve: The tricuspid valve is normal in structure. Tricuspid valve regurgitation is not demonstrated. No evidence of tricuspid stenosis. Aortic Valve: The aortic valve is tricuspid. Aortic valve regurgitation is not visualized. No aortic stenosis is present. Aortic valve mean gradient measures 3.0 mmHg. Aortic valve peak gradient measures 5.1 mmHg. Aortic valve area, by VTI measures 2.43 cm. Pulmonic Valve: The pulmonic valve was normal in structure. Pulmonic valve regurgitation is not visualized. No evidence of pulmonic stenosis. Aorta: The aortic root is normal in size and structure. There is mild dilatation of the ascending aorta, measuring 42 mm. Venous: The inferior vena cava was not well visualized. The inferior vena cava is normal in size with greater than 50% respiratory variability, suggesting right atrial pressure of 3 mmHg. IAS/Shunts: No atrial level shunt detected by color flow Doppler.  LEFT VENTRICLE PLAX 2D LVIDd:         4.00 cm     Diastology LVIDs:         2.70 cm     LV e' medial:    5.66 cm/s LV PW:         0.80 cm     LV E/e' medial:  12.7 LV IVS:        0.99 cm     LV e' lateral:   9.25 cm/s LVOT diam:     2.20 cm     LV E/e' lateral: 7.7 LV SV:         58 LV SV Index:   25 LVOT Area:     3.80 cm  LV Volumes (MOD) LV vol d, MOD A4C: 38.0 ml LV vol s, MOD A4C: 11.2 ml LV SV MOD A4C:     38.0 ml RIGHT VENTRICLE RV S prime:     10.40 cm/s TAPSE (M-mode): 2.1 cm LEFT ATRIUM             Index      RIGHT ATRIUM          Index LA diam:        2.70 cm 1.17 cm/m RA Area:     6.56 cm LA Vol (A2C):   14.2 ml 6.14 ml/m RA Volume:   8.77 ml  3.79 ml/m LA Vol (A4C):   10.2 ml 4.41 ml/m LA Biplane  Vol: 12.4 ml 5.36 ml/m  AORTIC VALVE                   PULMONIC VALVE AV Area (Vmax):    2.88 cm    PV Vmax:       0.54 m/s AV Area (Vmean):   2.59  cm    PV Peak grad:  1.1 mmHg AV Area (VTI):     2.43 cm AV Vmax:           113.00 cm/s AV Vmean:          80.900 cm/s AV VTI:            0.239 m AV Peak Grad:      5.1 mmHg AV Mean Grad:      3.0 mmHg LVOT Vmax:         85.50 cm/s LVOT Vmean:        55.200 cm/s LVOT VTI:          0.153 m LVOT/AV VTI ratio: 0.64  AORTA Ao Root diam: 3.40 cm Ao Asc diam:  4.20 cm MITRAL VALVE MV Area (PHT): 2.75 cm    SHUNTS MV E velocity: 71.60 cm/s  Systemic VTI:  0.15 m MV A velocity: 93.80 cm/s  Systemic Diam: 2.20 cm MV E/A ratio:  0.76 Skeet Latch MD Electronically signed by Skeet Latch MD Signature Date/Time: 03/19/2021/4:57:01 PM    Final    CT HEAD CODE STROKE WO CONTRAST  Result Date: 03/18/2021 CLINICAL DATA:  Code stroke. EXAM: CT HEAD WITHOUT CONTRAST TECHNIQUE: Contiguous axial images were obtained from the base of the skull through the vertex without intravenous contrast. COMPARISON:  None available. FINDINGS: Brain: No evidence of acute infarction, hemorrhage, cerebral edema, mass, mass effect, or midline shift. Ventricles and sulci are normal for age. No extra-axial fluid collection. Periventricular white matter changes, likely the sequela of chronic small vessel ischemic disease. Vascular: No hyperdense vessel or unexpected calcification. Skull: Normal. Negative for fracture or focal lesion. Sinuses/Orbits: No acute finding. Other: The mastoid air cells are well aerated. ASPECTS [facility] Stroke Program Early CT Score) - Ganglionic level infarction (caudate, lentiform nuclei, internal capsule, insula, M1-M3 cortex): 7 - Supraganglionic infarction (M4-M6 cortex): 3 Total score (0-10 with 10 being normal): 10 IMPRESSION: 1. No acute intracranial process. 2. ASPECTS is 10 Code stroke imaging results were communicated on 03/18/2021 at 8:57 am to provider Dr.  Dina Rich Via telephone, who verbally acknowledged these results. Electronically Signed   By: Merilyn Baba M.D.   On: 03/18/2021 11:58   CT ANGIO HEAD NECK W WO CM (CODE STROKE)  Result Date: 03/18/2021 CLINICAL DATA:  Stroke code, syncope when bending over EXAM: CT ANGIOGRAPHY HEAD AND NECK TECHNIQUE: Multidetector CT imaging of the head and neck was performed using the standard protocol during bolus administration of intravenous contrast. Multiplanar CT image reconstructions and MIPs were obtained to evaluate the vascular anatomy. Carotid stenosis measurements (when applicable) are obtained utilizing NASCET criteria, using the distal internal carotid diameter as the denominator. CONTRAST:  199m OMNIPAQUE IOHEXOL 350 MG/ML SOLN COMPARISON:  No prior CTA. FINDINGS: CT HEAD FINDINGS Please see same day CT brain stroke code. CTA NECK FINDINGS Aortic arch: Two-vessel arch with common origin of the left common carotid and brachiocephalic arteries. Imaged portion shows no evidence of aneurysm or dissection. No significant stenosis of the major arch vessel origins. Right carotid system: No evidence of dissection, stenosis (50% or greater) or occlusion. Left carotid system: No evidence of dissection, stenosis (50% or greater) or occlusion. Vertebral arteries: Codominant. No evidence of dissection, stenosis (50% or greater) or occlusion. Skeleton: No acute osseous abnormality. Other neck: Negative Upper chest: No focal pulmonary opacity. Bibasilar atelectasis. No pleural effusion. Review of the MIP images confirms the above findings CTA HEAD FINDINGS Anterior circulation: Both internal carotid  arteries are widely patent to the termini, without stenosis or other abnormality. A1 segments patent. Normal anterior communicating artery. Anterior cerebral arteries are widely patent to their distal aspects. No M1 stenosis or occlusion. Normal MCA bifurcations. Distal MCA branches well perfused and symmetric. Posterior  circulation: Vertebral arteries widely patent to the vertebrobasilar junction without stenosis. Posterior inferior cerebral arteries patent bilaterally. Basilar patent to its distal aspect. Superior cerebral arteries patent bilaterally. PCAs well perfused to their distal aspects without stenosis. Venous sinuses: As permitted by contrast timing, patent. Anatomic variants: Posterior communicating arteries are not visualized. Review of the MIP images confirms the above findings IMPRESSION: No large vessel occlusion or hemodynamically significant stenosis. Electronically Signed   By: Merilyn Baba M.D.   On: 03/18/2021 12:30

## 2021-03-21 NOTE — Progress Notes (Signed)
Physical Therapy Treatment Patient Details Name: Jake Montgomery MRN: [id number] DOB: [birth year] Today's Date: 03/21/2021   History of Present Illness 75 y.o. male presented 03/19/21 with weakness and syncope. Seen in ED with negative MRI, sent home, near syncope with dizziness/vertigo and returned to ED.   PMH significant of DM; HTN; CAD; and OSA on CPAP .    PT Comments    Pt up in recliner on arrival with reports of feeling mildly lightheaded. BP obtained sitting, with initial stance, and after standing 3 minutes. Pt required min guard assist transfers without AD, and min guard assist ambulation 40' with RW. Pt reports increased lightheadedness with stance. During session, pt reported need for BM. Assisted to/from bathroom and to sink to wash hands. Pt independent with toilet hygiene.  Pt in recliner at end of session.                                                   BP              HR Supine (earlier this AM)       147/79          91 Sitting (recliner on arrival)   128/70         100 Initial stand                            121/67         111 Stand after 3 minutes            114/67         112   Recommendations for follow up therapy are one component of a multi-disciplinary discharge planning process, led by the attending physician.  Recommendations may be updated based on patient status, additional functional criteria and insurance authorization.  Follow Up Recommendations  No PT follow up     Equipment Recommendations  None recommended by PT    Recommendations for Other Services       Precautions / Restrictions Precautions Precautions: Fall Precaution Comments: recent syncope with fall, +orthostatics     Mobility  Bed Mobility               General bed mobility comments: up in recliner pre and post-session    Transfers Overall transfer level: Needs assistance Equipment used: Ambulation equipment used Transfers: Sit to/from [facility] Sit to  Stand: Min guard Stand pivot transfers: Min guard       General transfer comment: min guard for safety  Ambulation/Gait Ambulation/Gait assistance: Min guard Gait Distance (Feet): 40 Feet Assistive device: Rolling walker (2 wheeled) Gait Pattern/deviations: Step-through pattern;Decreased stride length Gait velocity: decreased   General Gait Details: Utilized RW for increased stability. Gait distance limited by pt need for BM. Assisted to bathroom prior to return to recliner.   Stairs             Wheelchair Mobility    Modified Rankin (Stroke Patients Only)       Balance Overall balance assessment: Mild deficits observed, not formally tested  Cognition Arousal/Alertness: Awake/alert Behavior During Therapy: WFL for tasks assessed/performed Overall Cognitive Status: Within Functional Limits for tasks assessed                                        Exercises      General Comments General comments (skin integrity, edema, etc.): Pt reports feeling lightheaded, standing > sitting.      Pertinent Vitals/Pain Pain Assessment: No/denies pain    Home Living                      Prior Function            PT Goals (current goals can now be found in the care plan section) Acute Rehab PT Goals Patient Stated Goal: home Progress towards PT goals: Progressing toward goals    Frequency    Min 2X/week      PT Plan Current plan remains appropriate    Co-evaluation              AM-PAC PT "6 Clicks" Mobility   Outcome Measure  Help needed turning from your back to your side while in a flat bed without using bedrails?: None Help needed moving from lying on your back to sitting on the side of a flat bed without using bedrails?: None Help needed moving to and from a bed to a chair (including a wheelchair)?: A Little Help needed standing up from a chair using your arms  (e.g., wheelchair or bedside chair)?: A Little Help needed to walk in hospital room?: A Little Help needed climbing 3-5 steps with a railing? : A Little 6 Click Score: 20    End of Session Equipment Utilized During Treatment: Gait belt Activity Tolerance: Treatment limited secondary to medical complications (Comment) (lightheaded, +orthostatics) Patient left: in chair;with call bell/phone within reach;with family/visitor present Nurse Communication: Mobility status PT Visit Diagnosis: Unsteadiness on feet (R26.81)     Time: [id number] PT Time Calculation (min) (ACUTE ONLY): 27 min  Charges:  $Gait Training: 23-37 mins                     Lorrin Goodell, PT  Office # [phone] Pager [phone]    Lorriane Shire 03/21/2021, 12:20 PM

## 2021-03-21 NOTE — Progress Notes (Signed)
Pt refused CPAP tonight.

## 2021-03-22 ENCOUNTER — Encounter (HOSPITAL_COMMUNITY): Payer: Self-pay | Admitting: Internal Medicine

## 2021-03-22 DIAGNOSIS — R55 Syncope and collapse: Secondary | ICD-10-CM | POA: Diagnosis not present

## 2021-03-22 LAB — GLUCOSE, CAPILLARY
Glucose-Capillary: 103 mg/dL — ABNORMAL HIGH (ref 70–99)
Glucose-Capillary: 173 mg/dL — ABNORMAL HIGH (ref 70–99)
Glucose-Capillary: 174 mg/dL — ABNORMAL HIGH (ref 70–99)
Glucose-Capillary: 164 mg/dL — ABNORMAL HIGH (ref 70–99)

## 2021-03-22 LAB — CBC WITH DIFFERENTIAL/PLATELET
Abs Immature Granulocytes: 0.01 10*3/uL (ref 0.00–0.07)
Basophils Absolute: 0 10*3/uL (ref 0.0–0.1)
Basophils Relative: 1 %
Eosinophils Absolute: 0.1 10*3/uL (ref 0.0–0.5)
Eosinophils Relative: 2 %
HCT: 36.6 % — ABNORMAL LOW (ref 39.0–52.0)
Hemoglobin: 12 g/dL — ABNORMAL LOW (ref 13.0–17.0)
Immature Granulocytes: 0 %
Lymphocytes Relative: 29 %
Lymphs Abs: 1.5 10*3/uL (ref 0.7–4.0)
MCH: 28.9 pg (ref 26.0–34.0)
MCHC: 32.8 g/dL (ref 30.0–36.0)
MCV: 88.2 fL (ref 80.0–100.0)
Monocytes Absolute: 0.8 10*3/uL (ref 0.1–1.0)
Monocytes Relative: 16 %
Neutro Abs: 2.6 10*3/uL (ref 1.7–7.7)
Neutrophils Relative %: 52 %
Platelets: 190 10*3/uL (ref 150–400)
RBC: 4.15 MIL/uL — ABNORMAL LOW (ref 4.22–5.81)
RDW: 13.6 % (ref 11.5–15.5)
WBC: 5.1 10*3/uL (ref 4.0–10.5)
nRBC: 0 % (ref 0.0–0.2)

## 2021-03-22 LAB — COMPREHENSIVE METABOLIC PANEL
ALT: 20 U/L (ref 0–44)
AST: 20 U/L (ref 15–41)
Albumin: 2.7 g/dL — ABNORMAL LOW (ref 3.5–5.0)
Alkaline Phosphatase: 56 U/L (ref 38–126)
Anion gap: 7 (ref 5–15)
BUN: 10 mg/dL (ref 8–23)
CO2: 24 mmol/L (ref 22–32)
Calcium: 8.3 mg/dL — ABNORMAL LOW (ref 8.9–10.3)
Chloride: 102 mmol/L (ref 98–111)
Creatinine, Ser: 0.92 mg/dL (ref 0.61–1.24)
GFR, Estimated: >60 mL/min (ref >60)
Glucose, Bld: 104 mg/dL — ABNORMAL HIGH (ref 70–99)
Potassium: 3.7 mmol/L (ref 3.5–5.1)
Sodium: 133 mmol/L — ABNORMAL LOW (ref 135–145)
Total Bilirubin: 0.9 mg/dL (ref 0.3–1.2)
Total Protein: 5.9 g/dL — ABNORMAL LOW (ref 6.5–8.1)

## 2021-03-22 LAB — PROCALCITONIN: Procalcitonin: 0.64 ng/mL

## 2021-03-22 LAB — MAGNESIUM: Magnesium: 2.1 mg/dL (ref 1.7–2.4)

## 2021-03-22 LAB — BRAIN NATRIURETIC PEPTIDE: B Natriuretic Peptide: 37.1 pg/mL (ref 0.0–100.0)

## 2021-03-22 MED ORDER — LACTATED RINGERS IV SOLN: INTRAVENOUS | Status: AC

## 2021-03-22 NOTE — Progress Notes (Signed)
PROGRESS NOTE                                                                                                                                                                                                             Patient Demographics:    Jake Montgomery, is a 75 y.o. male, DOB - Jan 09, 1946, [id number]  Outpatient Primary MD for the patient is Jilda Panda, MD    LOS - 2  Admit date - 03/18/2021    Chief Complaint  Patient presents with   Weakness       Brief Narrative (HPI from H&P)  Jake Montgomery is a 75 y.o. male with medical history significant of DM; HTN; CAD; and OSA on CPAP presenting with weakness.  He came in the first time because he fell and blanked out, he was admitted for syncope work-up.   Subjective:   Patient in bed, appears comfortable, denies any headache, no fever, no chest pain or pressure, no shortness of breath , no abdominal pain. No new focal weakness.    Assessment  & Plan :     Syncope and dizziness most likely due to hypotension from multiple blood pressure medications at very high dosages, MRI brain, CT a head and neck, echocardiogram all nonacute, he is still dizzy upon sitting up and standing, supine blood pressure is low, will hydrate with IV fluids, have discontinued his ARB and cut down his Coreg monitor.  Advance activity and monitor.  He is morbidly obese and lives with an elderly wife, not safe for discharge currently with ongoing dizziness and high fall risk.  2. Morbid obesity.  BMI 39.  Follow with PCP for weight loss.  Continue CPAP nightly.  3.  Reported gross hematuria - UA notes, stable [place], hold ASA, outpt Urology follow up.  4. CAD -no acute issues, echocardiogram stable, currently treated with beta-blocker and statin for secondary prevention, resume aspirin from tomorrow as he mentioned he had strokes.  5.BPH - post LYFT procedure, multiple UTIs per family recently  including sepsis admission due to UTI, had chills late on 03/21/21 - did have hematuria upon admission, does have some dysuria, trial of Rocephin, UA and culture inconclusive.  6. DM2  - continue Lantus and sliding scale.  Lab Results  Component Value Date   HGBA1C 8.1 (H) 03/19/2021  CBG (last 3)  Recent Labs    03/21/21 1708 03/21/21 2043 03/22/21 0739  GLUCAP 135* 152* 103*          Condition - Fair  Family Communication  :  None present  Code Status :  Full  Consults  :  None  PUD Prophylaxis : None   Procedures  :     TTE -  1. Left ventricular ejection fraction, by estimation, is 60 to 65%. The left ventricle has normal function. The left ventricle has no regional wall motion abnormalities. Left ventricular diastolic parameters are consistent with Grade I diastolic dysfunction (impaired relaxation).  2. Right ventricular systolic function is normal. The right ventricular size is normal.  3. The mitral valve is normal in structure. No evidence of mitral valve regurgitation. No evidence of mitral stenosis.  4. The aortic valve is tricuspid. Aortic valve regurgitation is not visualized. No aortic stenosis is present.  5. There is mild dilatation of the ascending aorta, measuring 42 mm.  6. The inferior vena cava is normal in size with greater than 50% respiratory variability, suggesting right atrial pressure of 3 mmHg.   MRI Brain, CTA Head and Neck - Non acute.      Disposition Plan  :    Status is: Observation  Dispo: The patient is from: Home              Anticipated d/c is to: Home              Patient currently is not medically stable to d/c.   Difficult to place patient No  DVT Prophylaxis  :    enoxaparin (LOVENOX) injection 40 mg Start: 03/19/21 1600    Lab Results  Component Value Date   PLT 190 03/22/2021    Diet :  Diet Order             Diet Carb Modified Fluid consistency: Thin; Room service appropriate? Yes  Diet effective now                     Inpatient Medications  Scheduled Meds:  carvedilol  3.125 mg Oral BID WC   enoxaparin (LOVENOX) injection  40 mg Subcutaneous Q24H   insulin aspart  0-15 Units Subcutaneous TID WC   insulin glargine-yfgn  40 Units Subcutaneous Daily   pravastatin  20 mg Oral QHS   sodium chloride flush  3 mL Intravenous Q12H   Continuous Infusions:  cefTRIAXone (ROCEPHIN)  IV Stopped (03/21/21 1601)   PRN Meds:.acetaminophen **OR** acetaminophen, [DISCONTINUED] ondansetron **OR** ondansetron (ZOFRAN) IV  Antibiotics  :    Anti-infectives (From admission, onward)    Start     Dose/Rate Route Frequency Ordered Stop   03/21/21 1500  cefTRIAXone (ROCEPHIN) 1 g in sodium chloride 0.9 % 100 mL IVPB        1 g 200 mL/hr over 30 Minutes Intravenous Every 24 hours 03/21/21 1421 03/24/21 1459        Time Spent in minutes  30   Lala Lund M.D on 03/22/2021 at 11:45 AM  To page go to [email or web address]   [facility] -  Office  [phone]    See all Orders from today for further details    Objective:   Vitals:   03/21/21 2329 03/22/21 0402 03/22/21 0500 03/22/21 0740  BP: 132/75 113/60  120/79  Pulse: 77 79  69  Resp: '16 17  18  '$ Temp: 98.5 F (36.9 C) 98.4 F (36.9 C)  98.8 F (37.1 C)  TempSrc: Oral Oral  Oral  SpO2: 97% 98%  99%  Weight:   119.9 kg   Height:        Wt Readings from Last 3 Encounters:  03/22/21 119.9 kg  03/18/21 121.6 kg  05/25/17 109.3 kg     Intake/Output Summary (Last 24 hours) at 03/22/2021 1145 Last data filed at 03/22/2021 1000 Gross per 24 hour  Intake 97.76 ml  Output 1200 ml  Net -1102.24 ml     Physical Exam  Awake Alert, No new F.N deficits, Normal affect [place].AT,PERRAL Supple Neck,No JVD, No cervical lymphadenopathy appriciated.  Symmetrical Chest wall movement, Good air movement bilaterally, CTAB RRR,No Gallops, Rubs or new Murmurs, No Parasternal Heave +ve B.Sounds, Abd Soft, No tenderness, No organomegaly  appriciated, No rebound - guarding or rigidity. No Cyanosis, Clubbing or edema, No new Rash or bruise      Data Review:    CBC Recent Labs  Lab 03/18/21 1138 03/19/21 0849 03/20/21 0135 03/21/21 0058 03/22/21 0049  WBC 5.4 6.7 8.6 5.4 5.1  HGB 14.5 14.7 12.8* 13.1 12.0*  HCT 42.9 45.1 38.6* 40.0 36.6*  PLT 267 248 217 177 190  MCV 89.0 90.6 88.1 91.1 88.2  MCH 30.1 29.5 29.2 29.8 28.9  MCHC 33.8 32.6 33.2 32.8 32.8  RDW 13.7 13.8 13.8 13.7 13.6  LYMPHSABS 1.7 1.2  --  1.6 1.5  MONOABS 0.6 0.7  --  1.2* 0.8  EOSABS 0.1 0.1  --  0.2 0.1  BASOSABS 0.1 0.1  --  0.1 0.0    Recent Labs  Lab 03/18/21 1138 03/19/21 0849 03/19/21 1138 03/20/21 0135 03/21/21 0058 03/21/21 0349 03/22/21 0049  NA 132* 134*  --  130*  --  134* 133*  K 4.2 4.3  --  4.2  --  4.1 3.7  CL 101 101  --  101  --  102 102  CO2 25 26  --  22  --  25 24  GLUCOSE 209* 213*  --  167*  --  125* 104*  BUN 9 9  --  14  --  12 10  CREATININE 0.93 1.04  --  1.12  --  1.01 0.92  CALCIUM 8.7* 9.1  --  8.5*  --  8.5* 8.3*  AST 20  --   --   --   --  24 20  ALT 23  --   --   --   --  19 20  ALKPHOS 78  --   --   --   --  58 56  BILITOT 0.6  --   --   --   --  0.9 0.9  ALBUMIN 3.7  --   --   --   --  2.9* 2.7*  MG  --   --   --   --   --  2.2 2.1  PROCALCITON  --   --   --  0.88  --  0.67 0.64  INR 1.1  --   --   --   --   --   --   TSH  --   --  0.698  --   --   --   --   HGBA1C  --  8.1*  --   --   --   --   --   BNP  --   --   --   --  48.4  --  37.1    ------------------------------------------------------------------------------------------------------------------  No results for input(s): CHOL, HDL, LDLCALC, TRIG, CHOLHDL, LDLDIRECT in the last 72 hours.  Lab Results  Component Value Date   HGBA1C 8.1 (H) 03/19/2021   ------------------------------------------------------------------------------------------------------------------ No results for input(s): TSH, T4TOTAL, T3FREE, THYROIDAB in the  last 72 hours.  Invalid input(s): FREET3   Cardiac Enzymes No results for input(s): CKMB, TROPONINI, MYOGLOBIN in the last 168 hours.  Invalid input(s): CK ------------------------------------------------------------------------------------------------------------------    Component Value Date/Time   BNP 37.1 03/22/2021 0049      Radiology Reports MR Brain Wo Contrast (neuro protocol)  Result Date: 03/18/2021 CLINICAL DATA:  Stroke suspected EXAM: MRI HEAD WITHOUT CONTRAST TECHNIQUE: Multiplanar, multiecho pulse sequences of the brain and surrounding structures were obtained without intravenous contrast. COMPARISON:  No prior MRI of the head available. Correlation is made with same day CT head FINDINGS: Evaluation is limited by motion artifact. Brain: No acute infarction, hemorrhage, hydrocephalus, extra-axial collection, or mass lesion. The ventricles and sulci are within normal limits for age. T2 hyperintense signal in the periventricular white matter, likely the sequela of chronic small vessel ischemic disease. No foci of susceptibility to suggest remote hemorrhage. Vascular: Normal flow voids. Skull and upper cervical spine: Normal marrow signal. Sinuses/Orbits: Status post right lens replacement. Otherwise negative. Other: The mastoids are well aerated. IMPRESSION: No acute intracranial process. Electronically Signed   By: Merilyn Baba M.D.   On: 03/18/2021 19:30   US RENAL  Result Date: 03/19/2021 CLINICAL DATA:  Hematuria. EXAM: RENAL / URINARY TRACT ULTRASOUND COMPLETE COMPARISON:  CT abdomen and pelvis 08/29/2018 FINDINGS: Image quality is degraded by patient body habitus. Right Kidney: Renal measurements: 9.9 x 5.8 x 5.4 cm = volume: 162 mL. Echogenicity within normal limits. No mass or hydronephrosis visualized. Left Kidney: Renal measurements: 12.1 x 6.3 x 5.2 cm = volume: 210 mL. Echogenicity within normal limits. No mass or hydronephrosis visualized. Bladder: Appears normal for  degree of bladder distention. Other: None. IMPRESSION: Negative renal ultrasound. Electronically Signed   By: Logan Bores M.D.   On: 03/19/2021 13:28   DG Chest Port 1 View  Result Date: 03/21/2021 CLINICAL DATA:  Shortness of breath and weakness. EXAM: PORTABLE CHEST 1 VIEW COMPARISON:  CT chest dated February 10, 2021. Chest x-ray dated December 30, 2020. FINDINGS: The heart size and mediastinal contours are within normal limits. Both lungs are clear. The visualized skeletal structures are unremarkable. IMPRESSION: No active disease. Electronically Signed   By: Titus Dubin M.D.   On: 03/21/2021 08:33   ECHOCARDIOGRAM COMPLETE  Result Date: 03/19/2021    ECHOCARDIOGRAM REPORT   Patient Name:   AMANDO MASSER [place] Date of Exam: 03/19/2021 Medical Rec #:  [id number]      Height:       68.0 in Accession #:    [id number]     Weight:       268.0 lb Date of Birth:  May 05, 1946     BSA:          2.314 m Patient Age:    56 years       BP:           132/80 mmHg Patient Gender: M              HR:           82 bpm. Exam Location:  Inpatient Procedure: 2D Echo, Cardiac Doppler and Color Doppler Indications:    R55 Syncope  History:        Patient has no prior history of  Echocardiogram examinations.                 Syncope.  Sonographer:    [place] Referring Phys: [place]  1. Left ventricular ejection fraction, by estimation, is 60 to 65%. The left ventricle has normal function. The left ventricle has no regional wall motion abnormalities. Left ventricular diastolic parameters are consistent with Grade I diastolic dysfunction (impaired relaxation).  2. Right ventricular systolic function is normal. The right ventricular size is normal.  3. The mitral valve is normal in structure. No evidence of mitral valve regurgitation. No evidence of mitral stenosis.  4. The aortic valve is tricuspid. Aortic valve regurgitation is not visualized. No aortic stenosis is present.  5. There is mild dilatation of the ascending  aorta, measuring 42 mm.  6. The inferior vena cava is normal in size with greater than 50% respiratory variability, suggesting right atrial pressure of 3 mmHg. FINDINGS  Left Ventricle: Left ventricular ejection fraction, by estimation, is 60 to 65%. The left ventricle has normal function. The left ventricle has no regional wall motion abnormalities. The left ventricular internal cavity size was normal in size. There is  no left ventricular hypertrophy. Left ventricular diastolic parameters are consistent with Grade I diastolic dysfunction (impaired relaxation). Right Ventricle: The right ventricular size is normal. No increase in right ventricular wall thickness. Right ventricular systolic function is normal. Left Atrium: Left atrial size was normal in size. Right Atrium: Right atrial size was normal in size. Pericardium: There is no evidence of pericardial effusion. Mitral Valve: The mitral valve is normal in structure. No evidence of mitral valve regurgitation. No evidence of mitral valve stenosis. Tricuspid Valve: The tricuspid valve is normal in structure. Tricuspid valve regurgitation is not demonstrated. No evidence of tricuspid stenosis. Aortic Valve: The aortic valve is tricuspid. Aortic valve regurgitation is not visualized. No aortic stenosis is present. Aortic valve mean gradient measures 3.0 mmHg. Aortic valve peak gradient measures 5.1 mmHg. Aortic valve area, by VTI measures 2.43 cm. Pulmonic Valve: The pulmonic valve was normal in structure. Pulmonic valve regurgitation is not visualized. No evidence of pulmonic stenosis. Aorta: The aortic root is normal in size and structure. There is mild dilatation of the ascending aorta, measuring 42 mm. Venous: The inferior vena cava was not well visualized. The inferior vena cava is normal in size with greater than 50% respiratory variability, suggesting right atrial pressure of 3 mmHg. IAS/Shunts: No atrial level shunt detected by color flow Doppler.  LEFT  VENTRICLE PLAX 2D LVIDd:         4.00 cm     Diastology LVIDs:         2.70 cm     LV e' medial:    5.66 cm/s LV PW:         0.80 cm     LV E/e' medial:  12.7 LV IVS:        0.99 cm     LV e' lateral:   9.25 cm/s LVOT diam:     2.20 cm     LV E/e' lateral: 7.7 LV SV:         58 LV SV Index:   25 LVOT Area:     3.80 cm  LV Volumes (MOD) LV vol d, MOD A4C: 38.0 ml LV vol s, MOD A4C: 11.2 ml LV SV MOD A4C:     38.0 ml RIGHT VENTRICLE RV S prime:     10.40 cm/s TAPSE (M-mode): 2.1 cm  LEFT ATRIUM             Index      RIGHT ATRIUM          Index LA diam:        2.70 cm 1.17 cm/m RA Area:     6.56 cm LA Vol (A2C):   14.2 ml 6.14 ml/m RA Volume:   8.77 ml  3.79 ml/m LA Vol (A4C):   10.2 ml 4.41 ml/m LA Biplane Vol: 12.4 ml 5.36 ml/m  AORTIC VALVE                   PULMONIC VALVE AV Area (Vmax):    2.88 cm    PV Vmax:       0.54 m/s AV Area (Vmean):   2.59 cm    PV Peak grad:  1.1 mmHg AV Area (VTI):     2.43 cm AV Vmax:           113.00 cm/s AV Vmean:          80.900 cm/s AV VTI:            0.239 m AV Peak Grad:      5.1 mmHg AV Mean Grad:      3.0 mmHg LVOT Vmax:         85.50 cm/s LVOT Vmean:        55.200 cm/s LVOT VTI:          0.153 m LVOT/AV VTI ratio: 0.64  AORTA Ao Root diam: 3.40 cm Ao Asc diam:  4.20 cm MITRAL VALVE MV Area (PHT): 2.75 cm    SHUNTS MV E velocity: 71.60 cm/s  Systemic VTI:  0.15 m MV A velocity: 93.80 cm/s  Systemic Diam: 2.20 cm MV E/A ratio:  0.76 Skeet Latch MD Electronically signed by Skeet Latch MD Signature Date/Time: 03/19/2021/4:57:01 PM    Final    CT HEAD CODE STROKE WO CONTRAST  Result Date: 03/18/2021 CLINICAL DATA:  Code stroke. EXAM: CT HEAD WITHOUT CONTRAST TECHNIQUE: Contiguous axial images were obtained from the base of the skull through the vertex without intravenous contrast. COMPARISON:  None available. FINDINGS: Brain: No evidence of acute infarction, hemorrhage, cerebral edema, mass, mass effect, or midline shift. Ventricles and sulci are normal for  age. No extra-axial fluid collection. Periventricular white matter changes, likely the sequela of chronic small vessel ischemic disease. Vascular: No hyperdense vessel or unexpected calcification. Skull: Normal. Negative for fracture or focal lesion. Sinuses/Orbits: No acute finding. Other: The mastoid air cells are well aerated. ASPECTS [facility] Stroke Program Early CT Score) - Ganglionic level infarction (caudate, lentiform nuclei, internal capsule, insula, M1-M3 cortex): 7 - Supraganglionic infarction (M4-M6 cortex): 3 Total score (0-10 with 10 being normal): 10 IMPRESSION: 1. No acute intracranial process. 2. ASPECTS is 10 Code stroke imaging results were communicated on 03/18/2021 at 8:57 am to provider Dr. Dina Rich Via telephone, who verbally acknowledged these results. Electronically Signed   By: Merilyn Baba M.D.   On: 03/18/2021 11:58   CT ANGIO HEAD NECK W WO CM (CODE STROKE)  Result Date: 03/18/2021 CLINICAL DATA:  Stroke code, syncope when bending over EXAM: CT ANGIOGRAPHY HEAD AND NECK TECHNIQUE: Multidetector CT imaging of the head and neck was performed using the standard protocol during bolus administration of intravenous contrast. Multiplanar CT image reconstructions and MIPs were obtained to evaluate the vascular anatomy. Carotid stenosis measurements (when applicable) are obtained utilizing NASCET criteria, using the distal internal carotid diameter as the  denominator. CONTRAST:  144m OMNIPAQUE IOHEXOL 350 MG/ML SOLN COMPARISON:  No prior CTA. FINDINGS: CT HEAD FINDINGS Please see same day CT brain stroke code. CTA NECK FINDINGS Aortic arch: Two-vessel arch with common origin of the left common carotid and brachiocephalic arteries. Imaged portion shows no evidence of aneurysm or dissection. No significant stenosis of the major arch vessel origins. Right carotid system: No evidence of dissection, stenosis (50% or greater) or occlusion. Left carotid system: No evidence of dissection, stenosis  (50% or greater) or occlusion. Vertebral arteries: Codominant. No evidence of dissection, stenosis (50% or greater) or occlusion. Skeleton: No acute osseous abnormality. Other neck: Negative Upper chest: No focal pulmonary opacity. Bibasilar atelectasis. No pleural effusion. Review of the MIP images confirms the above findings CTA HEAD FINDINGS Anterior circulation: Both internal carotid arteries are widely patent to the termini, without stenosis or other abnormality. A1 segments patent. Normal anterior communicating artery. Anterior cerebral arteries are widely patent to their distal aspects. No M1 stenosis or occlusion. Normal MCA bifurcations. Distal MCA branches well perfused and symmetric. Posterior circulation: Vertebral arteries widely patent to the vertebrobasilar junction without stenosis. Posterior inferior cerebral arteries patent bilaterally. Basilar patent to its distal aspect. Superior cerebral arteries patent bilaterally. PCAs well perfused to their distal aspects without stenosis. Venous sinuses: As permitted by contrast timing, patent. Anatomic variants: Posterior communicating arteries are not visualized. Review of the MIP images confirms the above findings IMPRESSION: No large vessel occlusion or hemodynamically significant stenosis. Electronically Signed   By: AMerilyn BabaM.D.   On: 03/18/2021 12:30

## 2021-03-23 DIAGNOSIS — R55 Syncope and collapse: Secondary | ICD-10-CM | POA: Diagnosis not present

## 2021-03-23 LAB — GLUCOSE, CAPILLARY
Glucose-Capillary: 139 mg/dL — ABNORMAL HIGH (ref 70–99)
Glucose-Capillary: 140 mg/dL — ABNORMAL HIGH (ref 70–99)

## 2021-03-23 MED ORDER — CEPHALEXIN 500 MG PO CAPS: 500.0000 mg | ORAL_CAPSULE | Freq: Three times a day (TID) | ORAL | 0 refills | Status: AC

## 2021-03-23 MED ORDER — TAMSULOSIN HCL 0.4 MG PO CAPS: 0.4000 mg | ORAL_CAPSULE | Freq: Every day | ORAL | 0 refills | Status: DC

## 2021-03-23 MED ORDER — CARVEDILOL 3.125 MG PO TABS: 3.1250 mg | ORAL_TABLET | Freq: Two times a day (BID) | ORAL | 0 refills | Status: AC

## 2021-03-23 NOTE — Discharge Summary (Signed)
Jake Montgomery [id number] DOB: April 27, 1946 DOA: 03/18/2021  PCP: Jilda Panda, MD  Admit date: 03/18/2021  Discharge date: 03/23/2021  Admitted From: Home   Disposition:  Home   Recommendations for Outpatient Follow-up:   Follow up with PCP in 1-2 weeks  PCP Please obtain BMP/CBC, 2 view CXR in 1week,  (see Discharge instructions)   PCP Please follow up on the following pending results:    Home Health: None   Equipment/Devices: None  Consultations: None  Discharge Condition: Stable    CODE STATUS: Full    Diet Recommendation: Heart Healthy Low Carb  Diet Order             Diet Carb Modified Fluid consistency: Thin; Room service appropriate? Yes  Diet effective now                    Chief Complaint  Patient presents with   Weakness     Brief history of present illness from the day of admission and additional interim summary    Jake Montgomery is a 75 y.o. male with medical history significant of DM; HTN; CAD; and OSA on CPAP presenting with weakness.  He came in the first time because he fell and blanked out, he was admitted for syncope work-up.                                                                 Hospital Course    Syncope and dizziness most likely due to hypotension from multiple blood pressure medications at very high dosages, MRI brain, CT a head and neck, echocardiogram all nonacute, he was hydrated with IVF, blood pressure medications were reduced, he is now symptom-free and cleared by PT to go home.   2. Morbid obesity.  BMI 39.  Follow with PCP for weight loss.  Continue CPAP nightly.   3.  Reported gross hematuria - UA notes, stable [place], hold ASA, outpt Urology follow up.   4. CAD -no acute issues, echocardiogram stable, currently treated with beta-blocker and statin for  secondary prevention, resume aspirin from tomorrow as he mentioned he had strokes.   5. BPH - post LYFT procedure, multiple UTIs per family recently including sepsis admission due to UTI, had chills late on 03/21/21 - did have hematuria upon admission, does have some dysuria, UA and culture inconclusive.  He was given trial of IV Rocephin to treat symptoms of UTI after which he remains on hold better, low-grade fevers have resolved, given his 4 more days of oral Keflex at discharge, requested to follow-up with his urologist within a week.   6. DM2  - continue home regimen.     Discharge diagnosis     Principal Problem:   Near syncope Active Problems:   Gross hematuria  Diabetes mellitus type 2 in obese ([place])   Essential hypertension   OSA on CPAP   Obesity, Class III, BMI 40-49.9 (morbid obesity) [facility])    Discharge instructions    Discharge Instructions     Discharge instructions   Complete by: As directed    Follow with Primary MD Jilda Panda, MD and your Urologist in 7 days   Get CBC, CMP, Magnesium-  checked next visit within 1 week by Primary MD    Activity: As tolerated with Full fall precautions use walker/cane & assistance as needed  Disposition Home   Diet: Heart Healthy Low Carb  Special Instructions: If you have smoked or chewed Tobacco  in the last 2 yrs please stop smoking, stop any regular Alcohol  and or any Recreational drug use.  On your next visit with your primary care physician please Get Medicines reviewed and adjusted.  Please request your Prim.MD to go over all Hospital Tests and Procedure/Radiological results at the follow up, please get all Hospital records sent to your Prim MD by signing hospital release before you go home.  If you experience worsening of your admission symptoms, develop shortness of breath, life threatening emergency, suicidal or homicidal thoughts you must seek medical attention immediately by calling 911 or calling your MD  immediately  if symptoms less severe.  You Must read complete instructions/literature along with all the possible adverse reactions/side effects for all the Medicines you take and that have been prescribed to you. Take any new Medicines after you have completely understood and accpet all the possible adverse reactions/side effects.   Increase activity slowly   Complete by: As directed        Discharge Medications   Allergies as of 03/23/2021   No Known Allergies      Medication List     STOP taking these medications    cyclobenzaprine 10 MG tablet Commonly known as: FLEXERIL   telmisartan 80 MG tablet Commonly known as: MICARDIS       TAKE these medications    aspirin 325 MG tablet Take 325 mg by mouth daily.   carvedilol 3.125 MG tablet Commonly known as: COREG Take 1 tablet (3.125 mg total) by mouth 2 (two) times daily with a meal. What changed:  medication strength how much to take when to take this   cephALEXin 500 MG capsule Commonly known as: KEFLEX Take 1 capsule (500 mg total) by mouth 3 (three) times daily for 4 days.   cetirizine 10 MG tablet Commonly known as: ZYRTEC Take 10 mg by mouth daily as needed for allergies.   glimepiride 4 MG tablet Commonly known as: AMARYL Take 4 mg by mouth daily.   pravastatin 20 MG tablet Commonly known as: PRAVACHOL Take 20 mg by mouth at bedtime.   tamsulosin 0.4 MG Caps capsule Commonly known as: FLOMAX Take 1 capsule (0.4 mg total) by mouth daily.   Tyler Aas FlexTouch 100 UNIT/ML FlexTouch Pen Generic drug: insulin degludec Inject 40 Units into the skin daily.         Follow-up Information     Jilda Panda, MD. Schedule an appointment as soon as possible for a visit in 3 days.   Specialty: Internal Medicine Contact information: [address] [place] [address] [phone]         [place]. Schedule an appointment as soon as possible for a visit today.   Contact  information: [place] [phone]  Major procedures and Radiology Reports - PLEASE review detailed and final reports thoroughly  -       MR Brain Wo Contrast (neuro protocol)  Result Date: 03/18/2021 CLINICAL DATA:  Stroke suspected EXAM: MRI HEAD WITHOUT CONTRAST TECHNIQUE: Multiplanar, multiecho pulse sequences of the brain and surrounding structures were obtained without intravenous contrast. COMPARISON:  No prior MRI of the head available. Correlation is made with same day CT head FINDINGS: Evaluation is limited by motion artifact. Brain: No acute infarction, hemorrhage, hydrocephalus, extra-axial collection, or mass lesion. The ventricles and sulci are within normal limits for age. T2 hyperintense signal in the periventricular white matter, likely the sequela of chronic small vessel ischemic disease. No foci of susceptibility to suggest remote hemorrhage. Vascular: Normal flow voids. Skull and upper cervical spine: Normal marrow signal. Sinuses/Orbits: Status post right lens replacement. Otherwise negative. Other: The mastoids are well aerated. IMPRESSION: No acute intracranial process. Electronically Signed   By: Merilyn Baba M.D.   On: 03/18/2021 19:30   US RENAL  Result Date: 03/19/2021 CLINICAL DATA:  Hematuria. EXAM: RENAL / URINARY TRACT ULTRASOUND COMPLETE COMPARISON:  CT abdomen and pelvis 08/29/2018 FINDINGS: Image quality is degraded by patient body habitus. Right Kidney: Renal measurements: 9.9 x 5.8 x 5.4 cm = volume: 162 mL. Echogenicity within normal limits. No mass or hydronephrosis visualized. Left Kidney: Renal measurements: 12.1 x 6.3 x 5.2 cm = volume: 210 mL. Echogenicity within normal limits. No mass or hydronephrosis visualized. Bladder: Appears normal for degree of bladder distention. Other: None. IMPRESSION: Negative renal ultrasound. Electronically Signed   By: Logan Bores M.D.   On: 03/19/2021 13:28    DG Chest Port 1 View  Result Date: 03/21/2021 CLINICAL DATA:  Shortness of breath and weakness. EXAM: PORTABLE CHEST 1 VIEW COMPARISON:  CT chest dated February 10, 2021. Chest x-ray dated December 30, 2020. FINDINGS: The heart size and mediastinal contours are within normal limits. Both lungs are clear. The visualized skeletal structures are unremarkable. IMPRESSION: No active disease. Electronically Signed   By: Titus Dubin M.D.   On: 03/21/2021 08:33   ECHOCARDIOGRAM COMPLETE  Result Date: 03/19/2021    ECHOCARDIOGRAM REPORT   Patient Name:   Jake Montgomery Reckner Date of Exam: 03/19/2021 Medical Rec #:  [id number]      Height:       68.0 in Accession #:    [id number]     Weight:       268.0 lb Date of Birth:  13-Jun-1946     BSA:          2.314 m Patient Age:    5 years       BP:           132/80 mmHg Patient Gender: M              HR:           82 bpm. Exam Location:  Inpatient Procedure: 2D Echo, Cardiac Doppler and Color Doppler Indications:    R55 Syncope  History:        Patient has no prior history of Echocardiogram examinations.                 Syncope.  Sonographer:    [place] Referring Phys: [place]  1. Left ventricular ejection fraction, by estimation, is 60 to 65%. The left ventricle has normal function. The left ventricle has no regional wall motion abnormalities. Left ventricular diastolic parameters are consistent with Grade I diastolic  dysfunction (impaired relaxation).  2. Right ventricular systolic function is normal. The right ventricular size is normal.  3. The mitral valve is normal in structure. No evidence of mitral valve regurgitation. No evidence of mitral stenosis.  4. The aortic valve is tricuspid. Aortic valve regurgitation is not visualized. No aortic stenosis is present.  5. There is mild dilatation of the ascending aorta, measuring 42 mm.  6. The inferior vena cava is normal in size with greater than 50% respiratory variability, suggesting right atrial pressure of  3 mmHg. FINDINGS  Left Ventricle: Left ventricular ejection fraction, by estimation, is 60 to 65%. The left ventricle has normal function. The left ventricle has no regional wall motion abnormalities. The left ventricular internal cavity size was normal in size. There is  no left ventricular hypertrophy. Left ventricular diastolic parameters are consistent with Grade I diastolic dysfunction (impaired relaxation). Right Ventricle: The right ventricular size is normal. No increase in right ventricular wall thickness. Right ventricular systolic function is normal. Left Atrium: Left atrial size was normal in size. Right Atrium: Right atrial size was normal in size. Pericardium: There is no evidence of pericardial effusion. Mitral Valve: The mitral valve is normal in structure. No evidence of mitral valve regurgitation. No evidence of mitral valve stenosis. Tricuspid Valve: The tricuspid valve is normal in structure. Tricuspid valve regurgitation is not demonstrated. No evidence of tricuspid stenosis. Aortic Valve: The aortic valve is tricuspid. Aortic valve regurgitation is not visualized. No aortic stenosis is present. Aortic valve mean gradient measures 3.0 mmHg. Aortic valve peak gradient measures 5.1 mmHg. Aortic valve area, by VTI measures 2.43 cm. Pulmonic Valve: The pulmonic valve was normal in structure. Pulmonic valve regurgitation is not visualized. No evidence of pulmonic stenosis. Aorta: The aortic root is normal in size and structure. There is mild dilatation of the ascending aorta, measuring 42 mm. Venous: The inferior vena cava was not well visualized. The inferior vena cava is normal in size with greater than 50% respiratory variability, suggesting right atrial pressure of 3 mmHg. IAS/Shunts: No atrial level shunt detected by color flow Doppler.  LEFT VENTRICLE PLAX 2D LVIDd:         4.00 cm     Diastology LVIDs:         2.70 cm     LV e' medial:    5.66 cm/s LV PW:         0.80 cm     LV E/e' medial:   12.7 LV IVS:        0.99 cm     LV e' lateral:   9.25 cm/s LVOT diam:     2.20 cm     LV E/e' lateral: 7.7 LV SV:         58 LV SV Index:   25 LVOT Area:     3.80 cm  LV Volumes (MOD) LV vol d, MOD A4C: 38.0 ml LV vol s, MOD A4C: 11.2 ml LV SV MOD A4C:     38.0 ml RIGHT VENTRICLE RV S prime:     10.40 cm/s TAPSE (M-mode): 2.1 cm LEFT ATRIUM             Index      RIGHT ATRIUM          Index LA diam:        2.70 cm 1.17 cm/m RA Area:     6.56 cm LA Vol (A2C):   14.2 ml 6.14 ml/m RA Volume:   8.77 ml  3.79 ml/m LA Vol (A4C):   10.2 ml 4.41 ml/m LA Biplane Vol: 12.4 ml 5.36 ml/m  AORTIC VALVE                   PULMONIC VALVE AV Area (Vmax):    2.88 cm    PV Vmax:       0.54 m/s AV Area (Vmean):   2.59 cm    PV Peak grad:  1.1 mmHg AV Area (VTI):     2.43 cm AV Vmax:           113.00 cm/s AV Vmean:          80.900 cm/s AV VTI:            0.239 m AV Peak Grad:      5.1 mmHg AV Mean Grad:      3.0 mmHg LVOT Vmax:         85.50 cm/s LVOT Vmean:        55.200 cm/s LVOT VTI:          0.153 m LVOT/AV VTI ratio: 0.64  AORTA Ao Root diam: 3.40 cm Ao Asc diam:  4.20 cm MITRAL VALVE MV Area (PHT): 2.75 cm    SHUNTS MV E velocity: 71.60 cm/s  Systemic VTI:  0.15 m MV A velocity: 93.80 cm/s  Systemic Diam: 2.20 cm MV E/A ratio:  0.76 Skeet Latch MD Electronically signed by Skeet Latch MD Signature Date/Time: 03/19/2021/4:57:01 PM    Final    CT HEAD CODE STROKE WO CONTRAST  Result Date: 03/18/2021 CLINICAL DATA:  Code stroke. EXAM: CT HEAD WITHOUT CONTRAST TECHNIQUE: Contiguous axial images were obtained from the base of the skull through the vertex without intravenous contrast. COMPARISON:  None available. FINDINGS: Brain: No evidence of acute infarction, hemorrhage, cerebral edema, mass, mass effect, or midline shift. Ventricles and sulci are normal for age. No extra-axial fluid collection. Periventricular white matter changes, likely the sequela of chronic small vessel ischemic disease. Vascular: No  hyperdense vessel or unexpected calcification. Skull: Normal. Negative for fracture or focal lesion. Sinuses/Orbits: No acute finding. Other: The mastoid air cells are well aerated. ASPECTS [facility] Stroke Program Early CT Score) - Ganglionic level infarction (caudate, lentiform nuclei, internal capsule, insula, M1-M3 cortex): 7 - Supraganglionic infarction (M4-M6 cortex): 3 Total score (0-10 with 10 being normal): 10 IMPRESSION: 1. No acute intracranial process. 2. ASPECTS is 10 Code stroke imaging results were communicated on 03/18/2021 at 8:57 am to provider Dr. Dina Rich Via telephone, who verbally acknowledged these results. Electronically Signed   By: Merilyn Baba M.D.   On: 03/18/2021 11:58   CT ANGIO HEAD NECK W WO CM (CODE STROKE)  Result Date: 03/18/2021 CLINICAL DATA:  Stroke code, syncope when bending over EXAM: CT ANGIOGRAPHY HEAD AND NECK TECHNIQUE: Multidetector CT imaging of the head and neck was performed using the standard protocol during bolus administration of intravenous contrast. Multiplanar CT image reconstructions and MIPs were obtained to evaluate the vascular anatomy. Carotid stenosis measurements (when applicable) are obtained utilizing NASCET criteria, using the distal internal carotid diameter as the denominator. CONTRAST:  160m OMNIPAQUE IOHEXOL 350 MG/ML SOLN COMPARISON:  No prior CTA. FINDINGS: CT HEAD FINDINGS Please see same day CT brain stroke code. CTA NECK FINDINGS Aortic arch: Two-vessel arch with common origin of the left common carotid and brachiocephalic arteries. Imaged portion shows no evidence of aneurysm or dissection. No significant stenosis of the major arch vessel origins. Right carotid system: No evidence of dissection,  stenosis (50% or greater) or occlusion. Left carotid system: No evidence of dissection, stenosis (50% or greater) or occlusion. Vertebral arteries: Codominant. No evidence of dissection, stenosis (50% or greater) or occlusion. Skeleton: No acute  osseous abnormality. Other neck: Negative Upper chest: No focal pulmonary opacity. Bibasilar atelectasis. No pleural effusion. Review of the MIP images confirms the above findings CTA HEAD FINDINGS Anterior circulation: Both internal carotid arteries are widely patent to the termini, without stenosis or other abnormality. A1 segments patent. Normal anterior communicating artery. Anterior cerebral arteries are widely patent to their distal aspects. No M1 stenosis or occlusion. Normal MCA bifurcations. Distal MCA branches well perfused and symmetric. Posterior circulation: Vertebral arteries widely patent to the vertebrobasilar junction without stenosis. Posterior inferior cerebral arteries patent bilaterally. Basilar patent to its distal aspect. Superior cerebral arteries patent bilaterally. PCAs well perfused to their distal aspects without stenosis. Venous sinuses: As permitted by contrast timing, patent. Anatomic variants: Posterior communicating arteries are not visualized. Review of the MIP images confirms the above findings IMPRESSION: No large vessel occlusion or hemodynamically significant stenosis. Electronically Signed   By: Merilyn Baba M.D.   On: 03/18/2021 12:30     Today   Subjective    Jake Montgomery today has no headache,no chest abdominal pain,no new weakness tingling or numbness, feels much better wants to go home today.     Objective   Blood pressure 132/83, pulse 72, temperature 98.1 F (36.7 C), temperature source Oral, resp. rate 17, height '5\' 8"'$  (1.727 m), weight 117 kg, SpO2 100 %.   Intake/Output Summary (Last 24 hours) at 03/23/2021 1009 Last data filed at 03/23/2021 0921 Gross per 24 hour  Intake 1381.67 ml  Output 2425 ml  Net -1043.33 ml    Exam  Awake Alert, No new F.N deficits, Normal affect .AT,PERRAL Supple Neck,No JVD, No cervical lymphadenopathy appriciated.  Symmetrical Chest wall movement, Good air movement bilaterally, CTAB RRR,No Gallops,Rubs or new  Murmurs, No Parasternal Heave +ve B.Sounds, Abd Soft, Non tender, No organomegaly appriciated, No rebound -guarding or rigidity. No Cyanosis, Clubbing or edema, No new Rash or bruise   Data Review   CBC w Diff:  Lab Results  Component Value Date   WBC 5.1 03/22/2021   HGB 12.0 (L) 03/22/2021   HCT 36.6 (L) 03/22/2021   PLT 190 03/22/2021   LYMPHOPCT 29 03/22/2021   MONOPCT 16 03/22/2021   EOSPCT 2 03/22/2021   BASOPCT 1 03/22/2021    CMP:  Lab Results  Component Value Date   NA 133 (L) 03/22/2021   K 3.7 03/22/2021   CL 102 03/22/2021   CO2 24 03/22/2021   BUN 10 03/22/2021   CREATININE 0.92 03/22/2021   PROT 5.9 (L) 03/22/2021   ALBUMIN 2.7 (L) 03/22/2021   BILITOT 0.9 03/22/2021   ALKPHOS 56 03/22/2021   AST 20 03/22/2021   ALT 20 03/22/2021  .   Total Time in preparing paper work, data evaluation and todays exam - 54 minutes  Lala Lund M.D on 03/23/2021 at 10:09 AM  [facility]

## 2021-03-23 NOTE — Progress Notes (Signed)
Pt & family given discharge instructions, prescriptions, and care notes. Pt verbalized understanding AEB no further questions or concerns at this time. IV was discontinued, no redness, pain, or swelling noted at this time. Telemetry discontinued and Centralized Telemetry was notified. Pt left the floor via wheelchair with staff in stable condition.

## 2021-03-23 NOTE — Discharge Instructions (Signed)
Follow with Primary MD Jilda Panda, MD and your Urologist in 7 days   Get CBC, CMP, Magnesium-  checked next visit within 1 week by Primary MD    Activity: As tolerated with Full fall precautions use walker/cane & assistance as needed  Disposition Home   Diet: Heart Healthy Low Carb  Special Instructions: If you have smoked or chewed Tobacco  in the last 2 yrs please stop smoking, stop any regular Alcohol  and or any Recreational drug use.  On your next visit with your primary care physician please Get Medicines reviewed and adjusted.  Please request your Prim.MD to go over all Hospital Tests and Procedure/Radiological results at the follow up, please get all Hospital records sent to your Prim MD by signing hospital release before you go home.  If you experience worsening of your admission symptoms, develop shortness of breath, life threatening emergency, suicidal or homicidal thoughts you must seek medical attention immediately by calling 911 or calling your MD immediately  if symptoms less severe.  You Must read complete instructions/literature along with all the possible adverse reactions/side effects for all the Medicines you take and that have been prescribed to you. Take any new Medicines after you have completely understood and accpet all the possible adverse reactions/side effects.

## 2021-03-24 LAB — URINE CULTURE: Culture: >=100000 — AB

## 2022-04-14 ENCOUNTER — Encounter: Payer: Self-pay | Admitting: Internal Medicine

## 2022-04-27 ENCOUNTER — Ambulatory Visit (AMBULATORY_SURGERY_CENTER): Payer: Self-pay

## 2022-04-27 ENCOUNTER — Other Ambulatory Visit: Payer: Self-pay

## 2022-04-27 VITALS — Ht 68.0 in | Wt 255.0 lb

## 2022-04-27 DIAGNOSIS — Z1211 Encounter for screening for malignant neoplasm of colon: Secondary | ICD-10-CM

## 2022-04-27 MED ORDER — PEG 3350-KCL-NA BICARB-NACL 420 G PO SOLR
4000.0000 mL | Freq: Once | ORAL | 0 refills | Status: AC
Start: 2022-04-27 — End: 2022-04-27

## 2022-04-27 NOTE — Progress Notes (Signed)
Denies allergies to eggs or soy products. Denies complication of anesthesia or sedation. Denies use of weight loss medication. Denies use of O2.   Emmi instructions given for colonoscopy.  

## 2022-05-19 ENCOUNTER — Encounter: Payer: Self-pay | Admitting: Internal Medicine

## 2022-05-21 ENCOUNTER — Telehealth: Payer: Self-pay | Admitting: Internal Medicine

## 2022-05-21 NOTE — Telephone Encounter (Signed)
Reached patient,. Ok to continue with prescribed laxative as long as he doesn't have diarrhea., knows to drink colonoscopy prep on Sunday.

## 2022-05-21 NOTE — Telephone Encounter (Signed)
Inbound call from patient stating he is taking kristalose and did not know if he can take since he is having a procedure on 11/20. Please advise.

## 2022-05-21 NOTE — Telephone Encounter (Signed)
Attempted to call patient .  No answer or voicemail available

## 2022-05-25 ENCOUNTER — Ambulatory Visit (AMBULATORY_SURGERY_CENTER): Payer: Medicare PPO | Admitting: Internal Medicine

## 2022-05-25 ENCOUNTER — Encounter: Payer: Self-pay | Admitting: Internal Medicine

## 2022-05-25 VITALS — BP 129/68 | HR 77 | Temp 96.6°F | Resp 22 | Ht 68.0 in | Wt 255.0 lb

## 2022-05-25 DIAGNOSIS — D123 Benign neoplasm of transverse colon: Secondary | ICD-10-CM | POA: Diagnosis not present

## 2022-05-25 DIAGNOSIS — D122 Benign neoplasm of ascending colon: Secondary | ICD-10-CM | POA: Diagnosis not present

## 2022-05-25 DIAGNOSIS — Z1211 Encounter for screening for malignant neoplasm of colon: Secondary | ICD-10-CM

## 2022-05-25 MED ORDER — SODIUM CHLORIDE 0.9 % IV SOLN: 500.0000 mL | Freq: Once | INTRAVENOUS | Status: DC

## 2022-05-25 NOTE — Progress Notes (Signed)
Pt's states no medical or surgical changes since previsit or office visit. 

## 2022-05-25 NOTE — Op Note (Signed)
[facility] Patient Name: Jake Montgomery Procedure Date: 05/25/2022 9:43 AM MRN: [id number] Endoscopist: Adline Mango Gisela , , [id number] Age: 76 Referring MD:  Date of Birth: 06-06-1946 Gender: Male Account #: [id number] Procedure:                Colonoscopy Indications:              Screening for colorectal malignant neoplasm Medicines:                Monitored Anesthesia Care Procedure:                Pre-Anesthesia Assessment:                           - Prior to the procedure, a History and Physical                            was performed, and patient medications and                            allergies were reviewed. The patient's tolerance of                            previous anesthesia was also reviewed. The risks                            and benefits of the procedure and the sedation                            options and risks were discussed with the patient.                            All questions were answered, and informed consent                            was obtained. Prior Anticoagulants: The patient has                            taken no anticoagulant or antiplatelet agents. ASA                            Grade Assessment: II - A patient with mild systemic                            disease. After reviewing the risks and benefits,                            the patient was deemed in satisfactory condition to                            undergo the procedure.                           After obtaining informed consent, the colonoscope  was passed under direct vision. Throughout the                            procedure, the patient's blood pressure, pulse, and                            oxygen saturations were monitored continuously. The                            Olympus CF-HQ190L [id number]) Colonoscope was                            introduced through the anus and advanced to the the                            terminal  ileum. The colonoscopy was performed                            without difficulty. The patient tolerated the                            procedure well. The quality of the bowel                            preparation was good. The terminal ileum, ileocecal                            valve, appendiceal orifice, and rectum were                            photographed. Scope In: 9:46:10 AM Scope Out: 10:03:06 AM Scope Withdrawal Time: 0 hours 12 minutes 59 seconds  Total Procedure Duration: 0 hours 16 minutes 56 seconds  Findings:                 The terminal ileum appeared normal.                           Five sessile polyps were found in the transverse                            colon and ascending colon. The polyps were 3 to 6                            mm in size. These polyps were removed with a cold                            snare. Resection and retrieval were complete.                           Multiple diverticula were found in the sigmoid                            colon, descending colon, transverse colon and  ascending colon.                           Non-bleeding internal hemorrhoids were found during                            retroflexion. Complications:            No immediate complications. Estimated Blood Loss:     Estimated blood loss was minimal. Impression:               - The examined portion of the ileum was normal.                           - Five 3 to 6 mm polyps in the transverse colon and                            in the ascending colon, removed with a cold snare.                            Resected and retrieved.                           - Diverticulosis in the sigmoid colon, in the                            descending colon, in the transverse colon and in                            the ascending colon.                           - Non-bleeding internal hemorrhoids. Recommendation:           - Discharge patient to home (with  escort).                           - Await pathology results.                           - The findings and recommendations were discussed                            with the patient. Dr Georgian Co "Lyndee Leo" Lorenso Courier,  05/25/2022 10:06:26 AM

## 2022-05-25 NOTE — Progress Notes (Signed)
Called to room to assist during endoscopic procedure.  Patient ID and intended procedure confirmed with present staff. Received instructions for my participation in the procedure from the performing physician.  

## 2022-05-25 NOTE — Progress Notes (Signed)
GASTROENTEROLOGY PROCEDURE H&P NOTE   Primary Care Physician: Jilda Panda, MD    Reason for Procedure:   Colon cancer screening  Plan:    Colonoscopy  Patient is appropriate for endoscopic procedure(s) in the ambulatory ([facility]) setting.  The nature of the procedure, as well as the risks, benefits, and alternatives were carefully and thoroughly reviewed with the patient. Ample time for discussion and questions allowed. The patient understood, was satisfied, and agreed to proceed.     HPI: Jake Montgomery is a 76 y.o. male who presents for colonoscopy for colon cancer screening. Denies blood in stools, changes in bowel habits, or unintentional weight loss. Denies family history of colon cancer. Last colonoscopy was several years ago that was reportedly normal.  Past Medical History:  Diagnosis Date   Allergy    Cataract    Diabetes mellitus without complication (HCC)    GERD (gastroesophageal reflux disease)    Hyperlipidemia    Hypertension    MI (myocardial infarction) ([facility])    Renal disorder    reucrrent UTI, nephrolithiasis   Sleep apnea     Past Surgical History:  Procedure Laterality Date   cataract surgery Left    REPLACEMENT TOTAL KNEE Left     Prior to Admission medications   Medication Sig Start Date End Date Taking? Authorizing Provider  aspirin 81 MG chewable tablet Chew 81 mg by mouth daily.   Yes [provider]  carvedilol (COREG) 3.125 MG tablet Take 1 tablet (3.125 mg total) by mouth 2 (two) times daily with a meal. 03/23/21  Yes Thurnell Lose, MD  glimepiride (AMARYL) 4 MG tablet Take 4 mg by mouth daily. 03/04/21  Yes [provider]  insulin degludec (TRESIBA FLEXTOUCH) 100 UNIT/ML FlexTouch Pen Inject 30 Units into the skin daily. 11/15/19  Yes [provider]  linaCLOtide (LINZESS PO) Take 72 mg by mouth daily.   Yes [provider]  pravastatin (PRAVACHOL) 20 MG tablet Take 20 mg by mouth at bedtime. 01/02/21   Yes [provider]  Semaglutide,0.25 or 0.'5MG'$ /DOS, (OZEMPIC, 0.25 OR 0.5 MG/DOSE,) 2 MG/1.5ML SOPN Inject 2 mg into the skin once a week.   Yes [provider]  telmisartan (MICARDIS) 80 MG tablet Take 80 mg by mouth daily.   Yes [provider]    Current Outpatient Medications  Medication Sig Dispense Refill   aspirin 81 MG chewable tablet Chew 81 mg by mouth daily.     carvedilol (COREG) 3.125 MG tablet Take 1 tablet (3.125 mg total) by mouth 2 (two) times daily with a meal. 60 tablet 0   glimepiride (AMARYL) 4 MG tablet Take 4 mg by mouth daily.     insulin degludec (TRESIBA FLEXTOUCH) 100 UNIT/ML FlexTouch Pen Inject 30 Units into the skin daily.     linaCLOtide (LINZESS PO) Take 72 mg by mouth daily.     pravastatin (PRAVACHOL) 20 MG tablet Take 20 mg by mouth at bedtime.     Semaglutide,0.25 or 0.'5MG'$ /DOS, (OZEMPIC, 0.25 OR 0.5 MG/DOSE,) 2 MG/1.5ML SOPN Inject 2 mg into the skin once a week.     telmisartan (MICARDIS) 80 MG tablet Take 80 mg by mouth daily.     Current Facility-Administered Medications  Medication Dose Route Frequency Provider Last Rate Last Admin   0.9 %  sodium chloride infusion  500 mL Intravenous Once Sharyn Creamer, MD        Allergies as of 05/25/2022   (No Known Allergies)  Family History  Problem Relation Age of Onset   Colon cancer Neg Hx    Esophageal cancer Neg Hx    Rectal cancer Neg Hx    Stomach cancer Neg Hx     Social History   Socioeconomic History   Marital status: Married    Spouse name: Not on file   Number of children: Not on file   Years of education: Not on file   Highest education level: Not on file  Occupational History   Not on file  Tobacco Use   Smoking status: Never   Smokeless tobacco: Never  Substance and Sexual Activity   Alcohol use: Yes    Comment: occ   Drug use: No   Sexual activity: Not on file  Other Topics Concern   Not on file  Social History Narrative   Not on file    Social Determinants of Health   Financial Resource Strain: Not on file  Food Insecurity: Not on file  Transportation Needs: Not on file  Physical Activity: Not on file  Stress: Not on file  Social Connections: Not on file  Intimate Partner Violence: Not on file    Physical Exam: Vital signs in last 24 hours: BP 132/73   Pulse 83   Temp (!) 96.6 F (35.9 C)   Ht '5\' 8"'$  (1.727 m)   Wt 255 lb (115.7 kg)   SpO2 97%   BMI 38.77 kg/m  GEN: NAD EYE: Sclerae anicteric ENT: MMM CV: Non-tachycardic Pulm: No increased work of breathing GI: Soft, NT/ND NEURO:  Alert & Oriented   Christia Reading, MD [facility]  05/25/2022 9:37 AM

## 2022-05-25 NOTE — Patient Instructions (Addendum)
-    5 polyps removed and sent to pathology. Await pathology results.  - The findings and recommendations were discussed with the patient (Polyps, diverticulosis and hemorrhoids)  YOU HAD AN ENDOSCOPIC PROCEDURE TODAY AT [facility]:   Refer to the procedure report that was given to you for any specific questions about what was found during the examination.  If the procedure report does not answer your questions, please call your gastroenterologist to clarify.  If you requested that your care partner not be given the details of your procedure findings, then the procedure report has been included in a sealed envelope for you to review at your convenience later.  YOU SHOULD EXPECT: Some feelings of bloating in the abdomen. Passage of more gas than usual.  Walking can help get rid of the air that was put into your GI tract during the procedure and reduce the bloating. If you had a lower endoscopy (such as a colonoscopy or flexible sigmoidoscopy) you may notice spotting of blood in your stool or on the toilet paper. If you underwent a bowel prep for your procedure, you may not have a normal bowel movement for a few days.  Please Note:  You might notice some irritation and congestion in your nose or some drainage.  This is from the oxygen used during your procedure.  There is no need for concern and it should clear up in a day or so.  SYMPTOMS TO REPORT IMMEDIATELY:  Following lower endoscopy (colonoscopy or flexible sigmoidoscopy):  Excessive amounts of blood in the stool  Significant tenderness or worsening of abdominal pains  Swelling of the abdomen that is new, acute  Fever of 100F or higher    For urgent or emergent issues, a gastroenterologist can be reached at any hour by calling [phone]. Do not use MyChart messaging for urgent concerns.    DIET:  We do recommend a small meal at first, but then you may proceed to your regular diet.  Drink plenty of fluids but you  should avoid alcoholic beverages for 24 hours.  ACTIVITY:  You should plan to take it easy for the rest of today and you should NOT DRIVE or use heavy machinery until tomorrow (because of the sedation medicines used during the test).    FOLLOW UP: Our staff will call the number listed on your records the next business day following your procedure.  We will call around 7:15- 8:00 am to check on you and address any questions or concerns that you may have regarding the information given to you following your procedure. If we do not reach you, we will leave a message.     If any biopsies were taken you will be contacted by phone or by letter within the next 1-3 weeks.  Please call us at [phone] if you have not heard about the biopsies in 3 weeks.    SIGNATURES/CONFIDENTIALITY: You and/or your care partner have signed paperwork which will be entered into your electronic medical record.  These signatures attest to the fact that that the information above on your After Visit Summary has been reviewed and is understood.  Full responsibility of the confidentiality of this discharge information lies with you and/or your care-partner.

## 2022-05-25 NOTE — Progress Notes (Signed)
Report to PACU, RN, vss, BBS= Clear.  

## 2022-05-26 ENCOUNTER — Telehealth: Payer: Self-pay

## 2022-05-26 NOTE — Telephone Encounter (Signed)
Post procedure follow up call, no answer 

## 2022-06-02 ENCOUNTER — Encounter: Payer: Self-pay | Admitting: Internal Medicine

## 2022-08-22 IMAGING — US US RENAL
1 series · 14 of 25 positions shown · non-contrast
Comparison: CT abdomen and pelvis 08/29/2018

CLINICAL DATA: Hematuria.

EXAM:
RENAL / URINARY TRACT ULTRASOUND COMPLETE

[Series 1: us renal · 67 acquisitions, 14 frames shown]
[im 1/67]
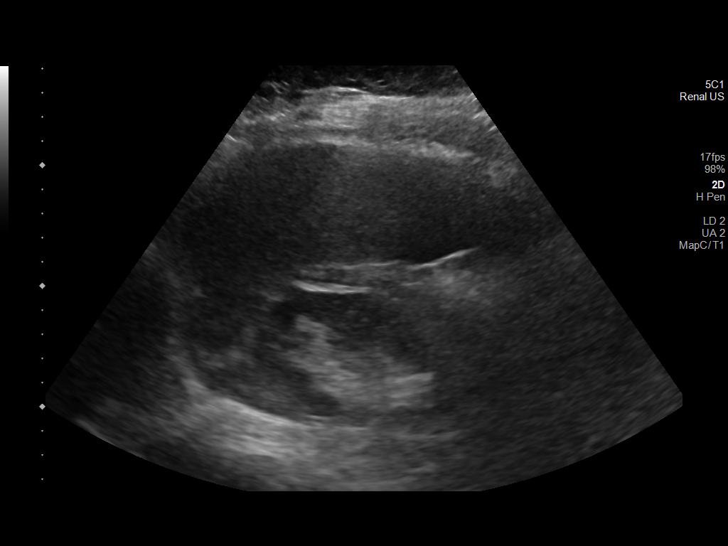
[im 6/67]
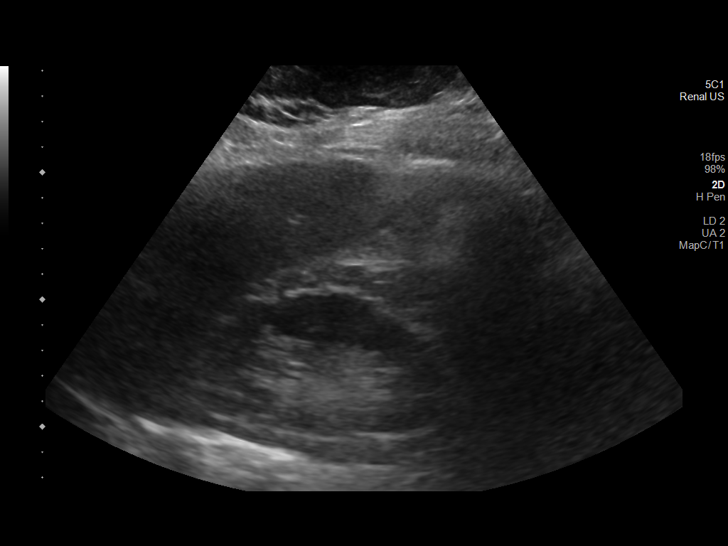
[im 12/67]
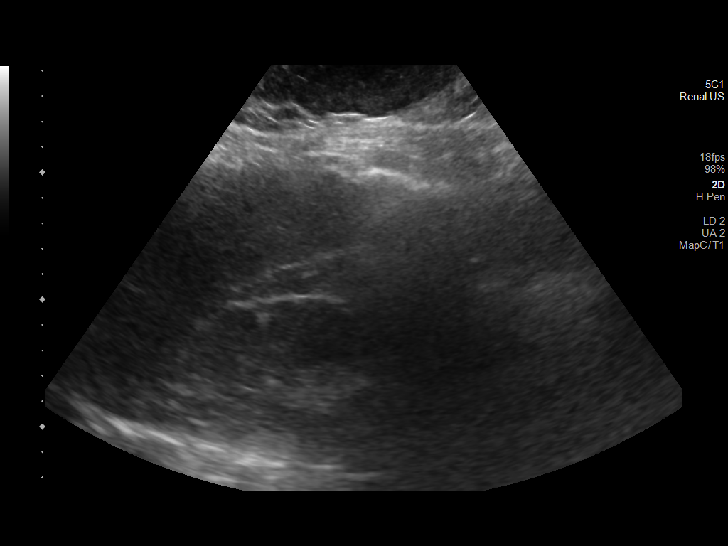
[im 17/67]
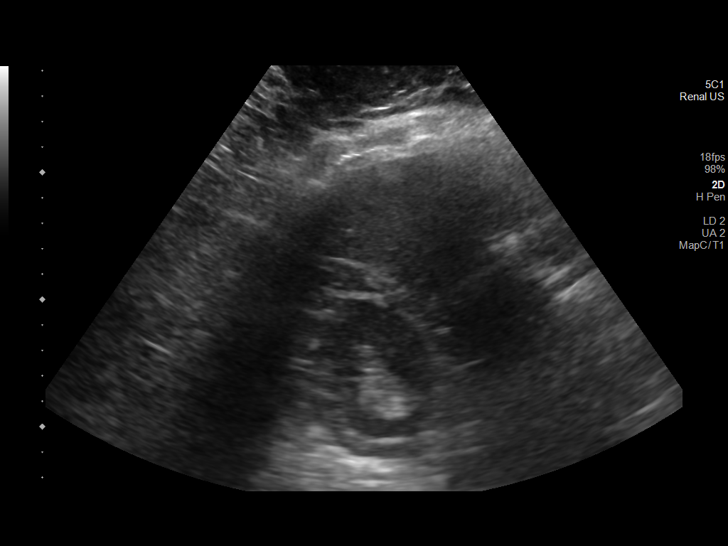
[im 23/67]
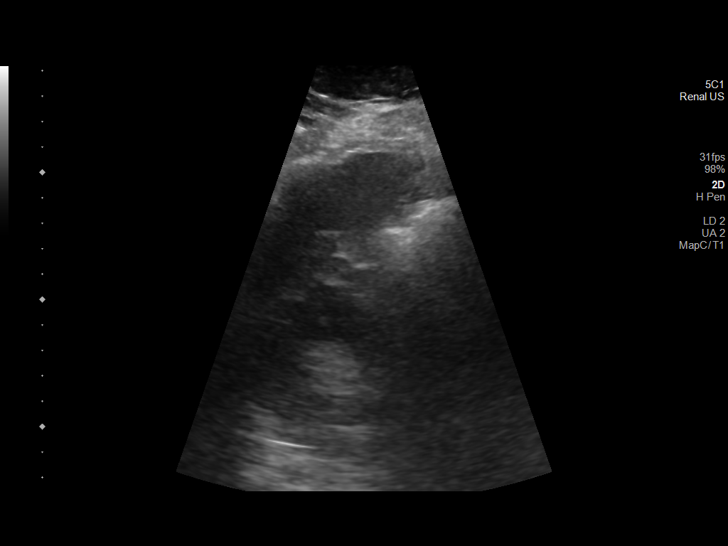
[im 25/67]
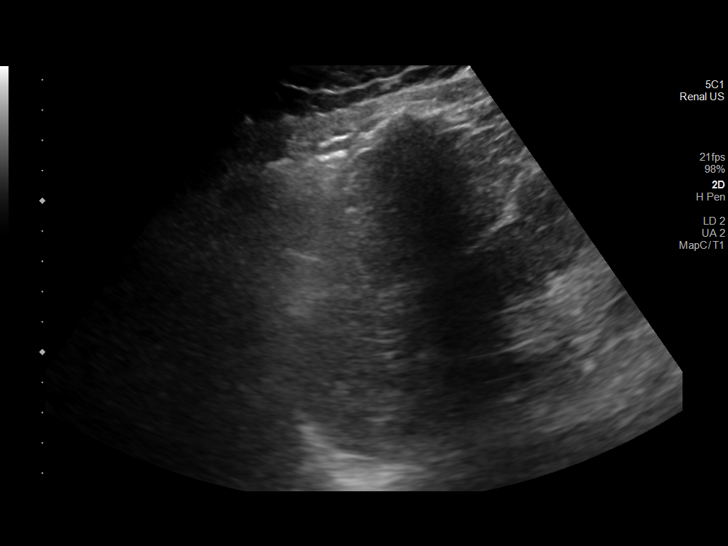
[im 31/67]
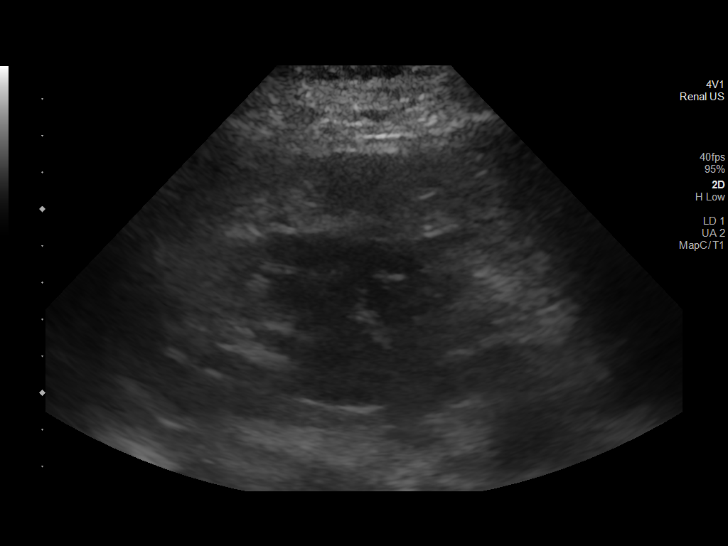
[im 36/67]
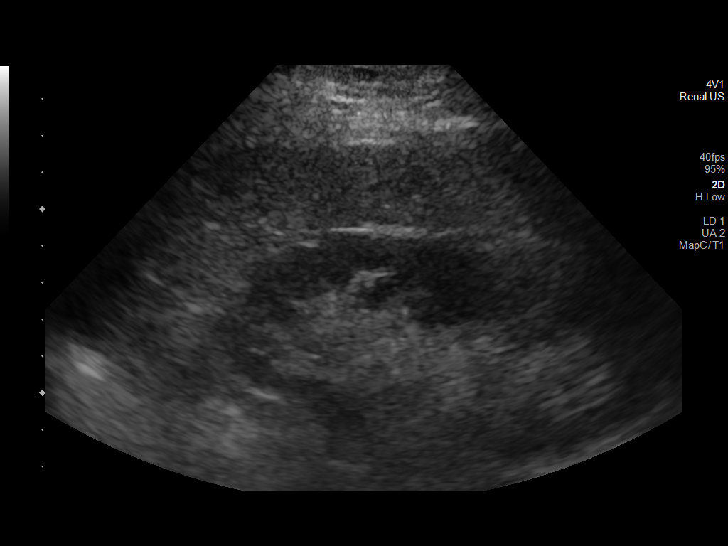
[im 42/67]
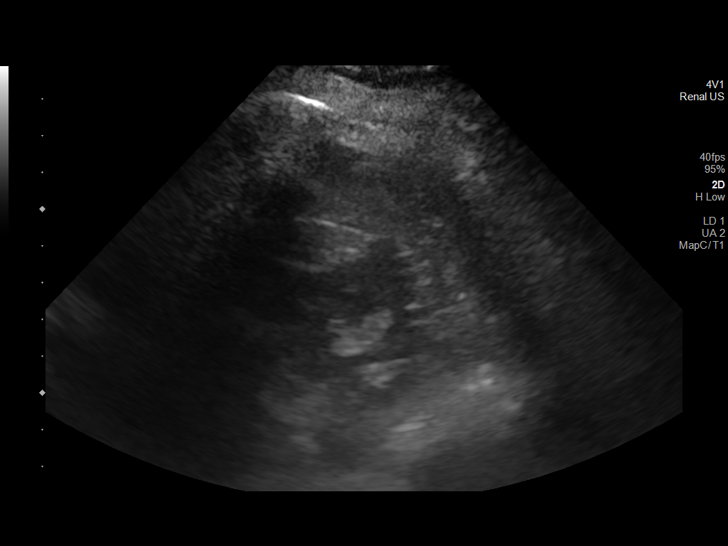
[im 45/67]
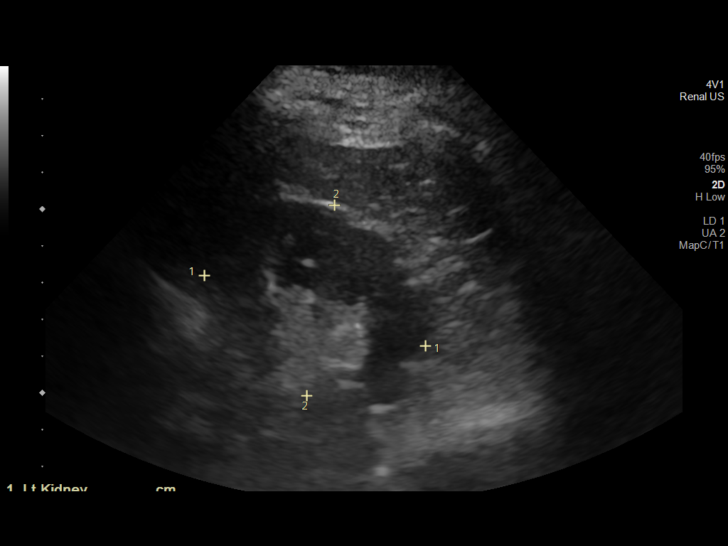
[im 50/67]
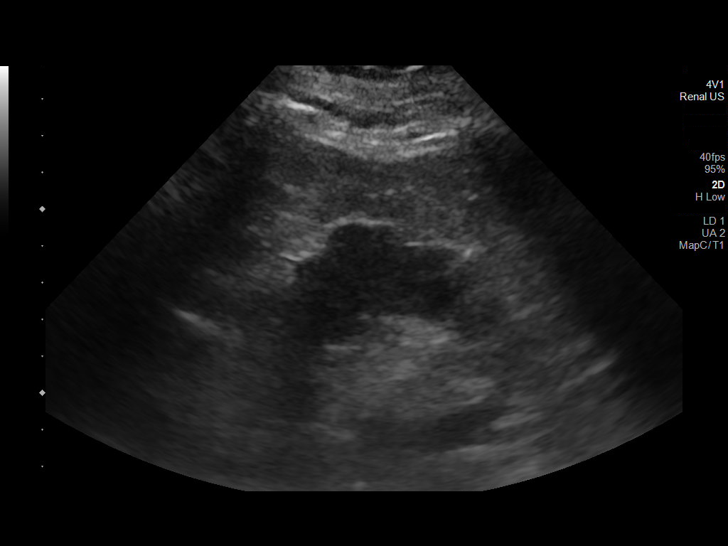
[im 56/67]
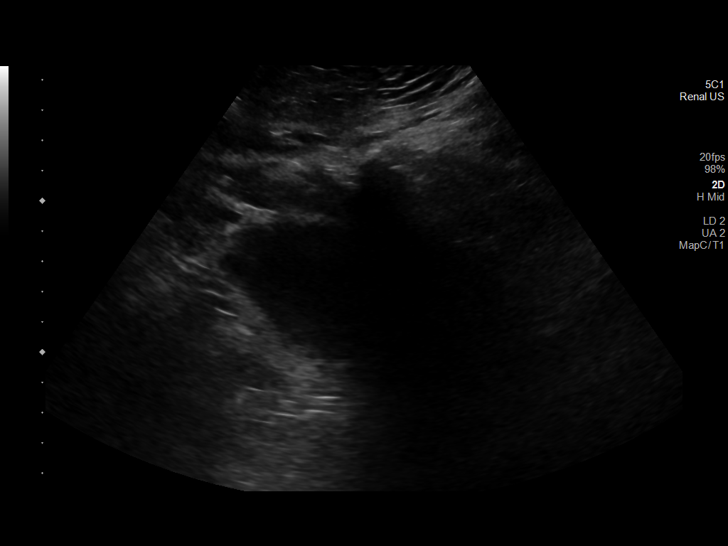
[im 61/67]
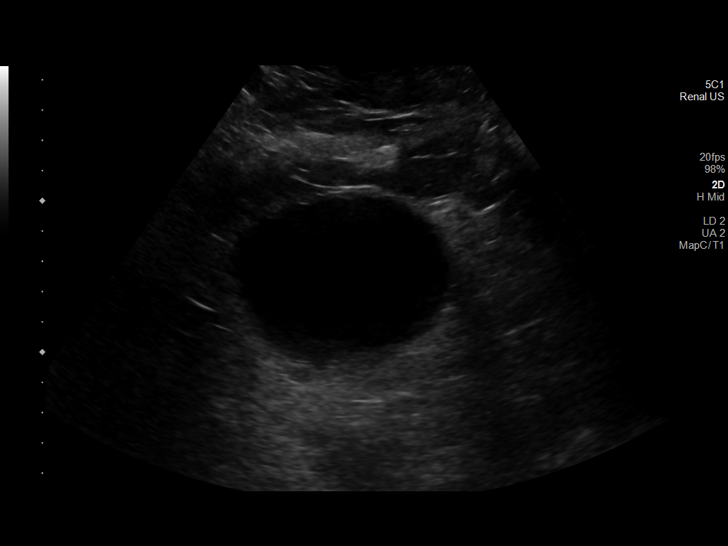
[im 67/67]
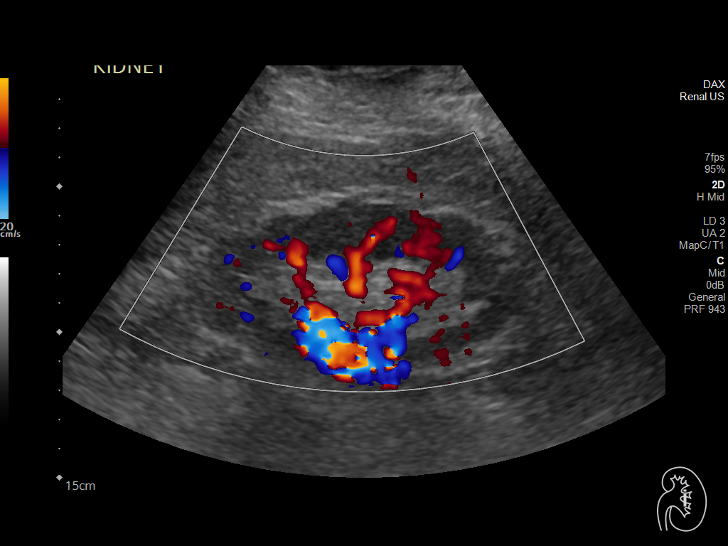

[14 of 25 positions shown; findings below may reference images not displayed]

FINDINGS: Image quality is degraded by patient body habitus.

Right Kidney:

Renal measurements: 9.9 x 5.8 x 5.4 cm = volume: 162 mL.
Echogenicity within normal limits. No mass or hydronephrosis
visualized.

Left Kidney:

Renal measurements: 12.1 x 6.3 x 5.2 cm = volume: 210 mL.
Echogenicity within normal limits. No mass or hydronephrosis
visualized.

Bladder:

Appears normal for degree of bladder distention.

Other:

None.
IMPRESSION: Negative renal ultrasound.

## 2023-11-23 ENCOUNTER — Ambulatory Visit: Admitting: Physician Assistant

## 2023-11-23 NOTE — Progress Notes (Deleted)
 11/23/2023 Jake Montgomery [id number] April 13, 1946  Referring provider: [name], MD Primary GI doctor: [name]  ASSESSMENT AND PLAN:  GERD  Personal history of colon polyps 05/25/2022 colonoscopy [name] good prep 5 TA polyps 3 to 6 mm transverse ascending colon, diverticulosis sigmoid descending transverse colon and ascending colon, nonbleeding internal hemorrhoids recall 05/2025  CAD  OSA  Type 2 diabetes  Patient Care Team: [name], MD as PCP - General (Internal Medicine)  HISTORY OF PRESENT ILLNESS: 78 y.o. male with a past medical history listed below presents for evaluation of ***.   *** Discussed the use of AI scribe software for clinical note transcription with the patient, who gave verbal consent to proceed.  History of Present Illness            He  reports that he has never smoked. He has never used smokeless tobacco. He reports current alcohol use. He reports that he does not use drugs.  RELEVANT GI HISTORY, IMAGING AND LABS: Results         03/2021 echocardiogram for syncope ejection fraction 60 to 65%, grade 1 diastolic, unremarkable valves CBC    Component Value Date/Time   WBC 5.1 03/22/2021 0049   RBC 4.15 (L) 03/22/2021 0049   HGB 12.0 (L) 03/22/2021 0049   HCT 36.6 (L) 03/22/2021 0049   PLT 190 03/22/2021 0049   MCV 88.2 03/22/2021 0049   MCH 28.9 03/22/2021 0049   MCHC 32.8 03/22/2021 0049   RDW 13.6 03/22/2021 0049   LYMPHSABS 1.5 03/22/2021 0049   MONOABS 0.8 03/22/2021 0049   EOSABS 0.1 03/22/2021 0049   BASOSABS 0.0 03/22/2021 0049   No results for input(s): "HGB" in the last 8760 hours.  CMP     Component Value Date/Time   NA 133 (L) 03/22/2021 0049   K 3.7 03/22/2021 0049   CL 102 03/22/2021 0049   CO2 24 03/22/2021 0049   GLUCOSE 104 (H) 03/22/2021 0049   BUN 10 03/22/2021 0049   CREATININE 0.92 03/22/2021 0049   CALCIUM 8.3 (L) 03/22/2021 0049   PROT 5.9 (L) 03/22/2021 0049   ALBUMIN 2.7 (L)  03/22/2021 0049   AST 20 03/22/2021 0049   ALT 20 03/22/2021 0049   ALKPHOS 56 03/22/2021 0049   BILITOT 0.9 03/22/2021 0049   GFRNONAA >60 03/22/2021 0049   GFRAA >60 12/26/2016 1930      Latest Ref Rng & Units 03/22/2021   12:49 AM 03/21/2021    3:49 AM 03/18/2021   11:38 AM  Hepatic Function  Total Protein 6.5 - 8.1 g/dL 5.9  6.3  7.4   Albumin 3.5 - 5.0 g/dL 2.7  2.9  3.7   AST 15 - 41 U/L 20  24  20    ALT 0 - 44 U/L 20  19  23    Alk Phosphatase 38 - 126 U/L 56  58  78   Total Bilirubin 0.3 - 1.2 mg/dL 0.9  0.9  0.6       Current Medications:   Current Outpatient Medications (Endocrine & Metabolic):    glimepiride (AMARYL) 4 MG tablet, Take 4 mg by mouth daily.   insulin  degludec (TRESIBA  FLEXTOUCH) 100 UNIT/ML FlexTouch Pen, Inject 30 Units into the skin daily.   Semaglutide,0.25 or 0.5MG /DOS, (OZEMPIC, 0.25 OR 0.5 MG/DOSE,) 2 MG/1.5ML SOPN, Inject 2 mg into the skin once a week.  Current Outpatient Medications (Cardiovascular):    carvedilol  (COREG ) 3.125 MG tablet, Take 1 tablet (3.125 mg total)  by mouth 2 (two) times daily with a meal.   pravastatin  (PRAVACHOL ) 20 MG tablet, Take 20 mg by mouth at bedtime.   telmisartan (MICARDIS) 80 MG tablet, Take 80 mg by mouth daily.   Current Outpatient Medications (Analgesics):    aspirin 81 MG chewable tablet, Chew 81 mg by mouth daily.   Current Outpatient Medications (Other):    linaCLOtide (LINZESS PO), Take 72 mg by mouth daily.  Medical History:  Past Medical History:  Diagnosis Date   Allergy    Cataract    Diabetes mellitus without complication (HCC)    GERD (gastroesophageal reflux disease)    Hyperlipidemia    Hypertension    MI (myocardial infarction) (HCC)    Renal disorder    reucrrent UTI, nephrolithiasis   Sleep apnea    Allergies: No Known Allergies   Surgical History:  He  has a past surgical history that includes Replacement total knee (Left) and cataract surgery (Left). Family History:  His  family history is not on file.  REVIEW OF SYSTEMS  : All other systems reviewed and negative except where noted in the History of Present Illness.  PHYSICAL EXAM: There were no vitals taken for this visit. Physical Exam          Edmonia Gottron, PA-C 7:45 AM

## 2023-12-16 NOTE — Progress Notes (Signed)
 12/17/2023 [name] [id number] 1946-01-28  Referring provider: [name], MD Primary GI doctor: Dr. Rosaline Montgomery  ASSESSMENT AND PLAN:  GERD worsening last 2 years with start of ozempic with dysphagia, consistent can be liquids or solids, has nocturnal symptoms Can feel food/drink into his mouth at night but no regurgitation, nausea, vomiting, no weight loss No NSAIDS, rare ETOH once a week, no tobacco use, drug use improved with voquezna and had no issues with swallowing, has not tried anything else - start on protonix 40 mg once a day -alginate therapy given -Lifestyle changes discussed, avoid NSAIDS, ETOH, hand out given to the patient -Due to issues with liquids and solids, to evaluate for dysmotility as well as achalasia and stenosis, will schedule for barium swallow before the EGD. -After discussion with the patient, he wishes to wait on the EGD which is reasonable with improvement of all of his symptoms with Voquezna, will see how he does with the pantoprazole and barium swallow, will evaluate next OV 6-8 weeks follow up, consider EGD at that time - gastroparesis diet given due to GLP1 - consider RUQ US    Personal history of colon polyps 05/25/2022 colonoscopy Dr. Rosaline Montgomery good prep 5 TA polyps 3 to 6 mm transverse ascending colon, diverticulosis sigmoid descending transverse colon and ascending colon, nonbleeding internal hemorrhoids  recall 05/2025  CAD Echo 2022 grade 1 diastolic, ER 60-65%, no valve issues Non obstructive, no history of stents, on bASA  OSA On a CPAP  Type 2 diabetes On ozempic x 2 years Likely contributing to his symptoms  Morbid obesity  Body mass index is 38.62 kg/m.  -Patient has been advised to make an attempt to improve diet and exercise patterns to aid in weight loss. -Recommended diet heavy in fruits and veggies and low in animal meats, cheeses, and dairy products, appropriate calorie intake  Patient Care Team: Jake Goo, MD as  PCP - General (Internal Medicine)  HISTORY OF PRESENT ILLNESS: 78 y.o. male with a past medical history listed below presents for evaluation of GERD.   Discussed the use of AI scribe software for clinical note transcription with the patient, who gave verbal consent to proceed.  History of Present Illness   Jake Montgomery is a 78 year old male who presents with worsening reflux and heartburn.  He experiences severe heartburn and reflux, which he describes as 'real bad.' These symptoms have worsened since starting Ozempic approximately two years ago. Initially, the heartburn occurred at night, but now it also happens during the day, particularly after eating. He describes a sensation of food stopping, which is consistent and sometimes occurs with liquids as well. No pain with swallowing, regurgitation, nausea, or vomiting.  He has been on Ozempic for about two years, which he believes correlates with the onset of his reflux symptoms. Despite the heartburn, his blood sugar levels have improved, with a recent reading of 6.2. He is currently still taking Ozempic.  He denies any use of Aleve, ibuprofen, or goatee powders. He occasionally consumes alcohol, about once a week or sometimes not at all in a month. He smokes cigarettes but denies any drug use.  He has a history of a heart problem about five to six years ago but has never had a stent placed and is not on a blood thinner, though he takes an 81 mg aspirin. He also mentions using a CPAP device but finds it bothersome and does not use it regularly.  He experiences occasional swelling  in his legs, which is not severe, and reports alternating between diarrhea and constipation without associated abdominal pain.      He  reports that he has never smoked. He has never used smokeless tobacco. He reports current alcohol use. He reports that he does not use drugs.  RELEVANT GI HISTORY, IMAGING AND LABS: Results   LABS HbA1c: 6.2 Hb: low  (08/2023)  DIAGNOSTIC Colonoscopy: Several tubular adenomatous polyps removed, diverticulosis, hemorrhoids (05/2022)     03/2021 echocardiogram for syncope ejection fraction 60 to 65%, grade 1 diastolic, unremarkable valves CBC    Component Value Date/Time   WBC 5.1 03/22/2021 0049   RBC 4.15 (L) 03/22/2021 0049   HGB 12.0 (L) 03/22/2021 0049   HCT 36.6 (L) 03/22/2021 0049   PLT 190 03/22/2021 0049   MCV 88.2 03/22/2021 0049   MCH 28.9 03/22/2021 0049   MCHC 32.8 03/22/2021 0049   RDW 13.6 03/22/2021 0049   LYMPHSABS 1.5 03/22/2021 0049   MONOABS 0.8 03/22/2021 0049   EOSABS 0.1 03/22/2021 0049   BASOSABS 0.0 03/22/2021 0049   No results for input(s): HGB in the last 8760 hours.  CMP     Component Value Date/Time   NA 133 (L) 03/22/2021 0049   K 3.7 03/22/2021 0049   CL 102 03/22/2021 0049   CO2 24 03/22/2021 0049   GLUCOSE 104 (H) 03/22/2021 0049   BUN 10 03/22/2021 0049   CREATININE 0.92 03/22/2021 0049   CALCIUM 8.3 (L) 03/22/2021 0049   PROT 5.9 (L) 03/22/2021 0049   ALBUMIN 2.7 (L) 03/22/2021 0049   AST 20 03/22/2021 0049   ALT 20 03/22/2021 0049   ALKPHOS 56 03/22/2021 0049   BILITOT 0.9 03/22/2021 0049   GFRNONAA >60 03/22/2021 0049   GFRAA >60 12/26/2016 1930      Latest Ref Rng & Units 03/22/2021   12:49 AM 03/21/2021    3:49 AM 03/18/2021   11:38 AM  Hepatic Function  Total Protein 6.5 - 8.1 g/dL 5.9  6.3  7.4   Albumin 3.5 - 5.0 g/dL 2.7  2.9  3.7   AST 15 - 41 U/L 20  24  20    ALT 0 - 44 U/L 20  19  23    Alk Phosphatase 38 - 126 U/L 56  58  78   Total Bilirubin 0.3 - 1.2 mg/dL 0.9  0.9  0.6       Current Medications:   Current Outpatient Medications (Endocrine & Metabolic):    glimepiride (AMARYL) 4 MG tablet, Take 4 mg by mouth daily.   insulin  degludec (TRESIBA  FLEXTOUCH) 100 UNIT/ML FlexTouch Pen, Inject 30 Units into the skin daily.   Semaglutide,0.25 or 0.5MG /DOS, (OZEMPIC, 0.25 OR 0.5 MG/DOSE,) 2 MG/1.5ML SOPN, Inject 2 mg into the skin  once a week.  Current Outpatient Medications (Cardiovascular):    carvedilol  (COREG ) 3.125 MG tablet, Take 1 tablet (3.125 mg total) by mouth 2 (two) times daily with a meal.   pravastatin  (PRAVACHOL ) 20 MG tablet, Take 20 mg by mouth at bedtime.   telmisartan (MICARDIS) 80 MG tablet, Take 80 mg by mouth daily.   Current Outpatient Medications (Analgesics):    aspirin 81 MG chewable tablet, Chew 81 mg by mouth daily.   Current Outpatient Medications (Other):    linaCLOtide (LINZESS PO), Take 72 mg by mouth daily.   pantoprazole (PROTONIX) 40 MG tablet, Take 1 tablet (40 mg total) by mouth daily.  Medical History:  Past Medical History:  Diagnosis Date  Allergy    Cataract    Diabetes mellitus without complication (HCC)    GERD (gastroesophageal reflux disease)    Hyperlipidemia    Hypertension    MI (myocardial infarction) (HCC)    Renal disorder    reucrrent UTI, nephrolithiasis   Sleep apnea    Allergies: No Known Allergies   Surgical History:  He  has a past surgical history that includes Replacement total knee (Left); cataract surgery (Left); and Bladder stone removal (N/A). Family History:  His family history is not on file.  REVIEW OF SYSTEMS  : All other systems reviewed and negative except where noted in the History of Present Illness.  PHYSICAL EXAM: BP 108/60   Pulse 88   Ht 5' 8 (1.727 m)   Wt 254 lb (115.2 kg)   BMI 38.62 kg/m  Physical Exam   GENERAL APPEARANCE: Well nourished, in no apparent distress HEENT: No cervical lymphadenopathy, unremarkable thyroid, sclerae anicteric, conjunctiva pink RESPIRATORY: Respiratory effort normal, BS equal bilateral without rales, rhonchi, wheezing CARDIO: RRR with no MRGs, peripheral pulses intact ABDOMEN: Soft, non distended, active bowel sounds in all 4 quadrants, no tenderness to palpation, no rebound, no mass appreciated RECTAL: Declines MUSCULOSKELETAL: Full ROM, normal gait, without edema SKIN: Dry, intact  without rashes or lesions. No jaundice. NEURO: Alert, oriented, no focal deficits PSYCH: Cooperative, normal mood and affect. EXTREMITIES: Mild leg swelling, ankles visible      Edmonia Gottron, PA-C 10:26 AM

## 2023-12-17 ENCOUNTER — Ambulatory Visit: Admitting: Physician Assistant

## 2023-12-17 ENCOUNTER — Encounter: Payer: Self-pay | Admitting: Physician Assistant

## 2023-12-17 ENCOUNTER — Other Ambulatory Visit (INDEPENDENT_AMBULATORY_CARE_PROVIDER_SITE_OTHER)

## 2023-12-17 ENCOUNTER — Ambulatory Visit: Payer: Self-pay | Admitting: Physician Assistant

## 2023-12-17 VITALS — BP 108/60 | HR 88 | Ht 68.0 in | Wt 254.0 lb

## 2023-12-17 DIAGNOSIS — E66813 Obesity, class 3: Secondary | ICD-10-CM

## 2023-12-17 DIAGNOSIS — G4733 Obstructive sleep apnea (adult) (pediatric): Secondary | ICD-10-CM

## 2023-12-17 DIAGNOSIS — I2510 Atherosclerotic heart disease of native coronary artery without angina pectoris: Secondary | ICD-10-CM | POA: Diagnosis not present

## 2023-12-17 DIAGNOSIS — Z6838 Body mass index (BMI) 38.0-38.9, adult: Secondary | ICD-10-CM

## 2023-12-17 DIAGNOSIS — E669 Obesity, unspecified: Secondary | ICD-10-CM

## 2023-12-17 DIAGNOSIS — K2100 Gastro-esophageal reflux disease with esophagitis, without bleeding: Secondary | ICD-10-CM

## 2023-12-17 DIAGNOSIS — K219 Gastro-esophageal reflux disease without esophagitis: Secondary | ICD-10-CM | POA: Diagnosis not present

## 2023-12-17 DIAGNOSIS — Z860101 Personal history of adenomatous and serrated colon polyps: Secondary | ICD-10-CM

## 2023-12-17 DIAGNOSIS — R1310 Dysphagia, unspecified: Secondary | ICD-10-CM

## 2023-12-17 DIAGNOSIS — E119 Type 2 diabetes mellitus without complications: Secondary | ICD-10-CM

## 2023-12-17 DIAGNOSIS — R1319 Other dysphagia: Secondary | ICD-10-CM

## 2023-12-17 DIAGNOSIS — Z7985 Long-term (current) use of injectable non-insulin antidiabetic drugs: Secondary | ICD-10-CM

## 2023-12-17 LAB — COMPREHENSIVE METABOLIC PANEL WITH GFR
ALT: 31 U/L (ref 0–53)
AST: 23 U/L (ref 0–37)
Albumin: 4 g/dL (ref 3.5–5.2)
Alkaline Phosphatase: 81 U/L (ref 39–117)
BUN: 10 mg/dL (ref 6–23)
CO2: 29 meq/L (ref 19–32)
Calcium: 8.9 mg/dL (ref 8.4–10.5)
Chloride: 101 meq/L (ref 96–112)
Creatinine, Ser: 0.96 mg/dL (ref 0.40–1.50)
GFR: 75.69 mL/min (ref >60.00)
Glucose, Bld: 135 mg/dL — ABNORMAL HIGH (ref 70–99)
Potassium: 4.3 meq/L (ref 3.5–5.1)
Sodium: 135 meq/L (ref 135–145)
Total Bilirubin: 0.5 mg/dL (ref 0.2–1.2)
Total Protein: 7.5 g/dL (ref 6.0–8.3)

## 2023-12-17 LAB — CBC WITH DIFFERENTIAL/PLATELET
Basophils Absolute: 0.1 10*3/uL (ref 0.0–0.1)
Basophils Relative: 1.2 % (ref 0.0–3.0)
Eosinophils Absolute: 0.1 10*3/uL (ref 0.0–0.7)
Eosinophils Relative: 1.8 % (ref 0.0–5.0)
HCT: 41.5 % (ref 39.0–52.0)
Hemoglobin: 14 g/dL (ref 13.0–17.0)
Lymphocytes Relative: 31 % (ref 12.0–46.0)
Lymphs Abs: 1.9 10*3/uL (ref 0.7–4.0)
MCHC: 33.7 g/dL (ref 30.0–36.0)
MCV: 88.9 fl (ref 78.0–100.0)
Monocytes Absolute: 0.7 10*3/uL (ref 0.1–1.0)
Monocytes Relative: 11.5 % (ref 3.0–12.0)
Neutro Abs: 3.3 10*3/uL (ref 1.4–7.7)
Neutrophils Relative %: 54.5 % (ref 43.0–77.0)
Platelets: 261 10*3/uL (ref 150.0–400.0)
RBC: 4.67 Mil/uL (ref 4.22–5.81)
RDW: 14 % (ref 11.5–15.5)
WBC: 6 10*3/uL (ref 4.0–10.5)

## 2023-12-17 MED ORDER — PANTOPRAZOLE SODIUM 40 MG PO TBEC: 40.0000 mg | DELAYED_RELEASE_TABLET | Freq: Every day | ORAL | 3 refills | Status: DC

## 2023-12-17 NOTE — Patient Instructions (Addendum)
 Your provider has requested that you go to the basement level for lab work before leaving today. Press B on the elevator. The lab is located at the first door on the left as you exit the elevator.  Dysphagia precautions:   1. Take reflux medications 30+ minutes before food in the morning, pantoprazole 40 mg daily 2. Begin meals with warm beverage 3. Eat smaller more frequent meals 4. Eat slowly, taking small bites and sips 5. Alternate solids and liquids 6. Avoid foods/liquids that increase acid production 7. Sit upright during and for 30+ minutes after meals to facilitate esophageal clearing 8. All meats should be chopped finely.   If something gets hung in your esophagus and will not come up or go down, proceed to the emergency room.   Reflux Gourmet Rescue  It is an ALGINATE THERAPY which is the only intervention that works to safeguard the esophagus by creating a protective barrier that actually stops reflux from happening. -The general directions for use are as stated on the packaging: Take 1 teaspoon (5 ml), or more as needed or as directed by your physician, after meals and before bed. -These general directions address the most common times for reflux to occur, but our Rescue products may be taken anytime. Some individuals may take our product preemptively, when they know they will suffer from reflux, or as needed - when discomfort arises. (If taken around food, it should be consumed last.) -You do not have to take 1 teaspoon (5 ml) of the product. While one teaspoon (5ml) may be the perfect average amount to relieve reflux suffering in some, others may require more or less. You may adjust the amount of Mint Chocolate Rescue and Vanilla Caramel Rescue to the lowest amount necessary to meet your individual needs to improve your quality of life. -You may dilute the product if it is too viscous for you to consume. Keep in mind, however, that the thickness of the product was formulated to  provide optimal coating and protection of your throat and esophagus. Though diluting the product is possible, it may reduce the protective function and/or length of action. -This can be used in conjunction with reflux medications and lifestyle changes.  100% ALL-NATURAL  Paraben FREE, glycerin FREE, & potassium FREE  Made entirely from all-natural ingredients considered safe for children and during pregnancy  No known side effects  All-natural flavor Gluten FREE  Allergen FREE  Vegan  Can find more information here: [email or web address]  You have been scheduled for a Barium Esophogram at [facility]  (1st floor of the hospital) on 01/11/2024 at 10 am. Please arrive 30 minutes prior to your appointment for registration. Make certain not to have anything to eat or drink 3 hours prior to your test. If you need to reschedule for any reason, please contact radiology at [phone] to do so. __________________________________________________________________ A barium swallow is an examination that concentrates on views of the esophagus. This tends to be a double contrast exam (barium and two liquids which, when combined, create a gas to distend the wall of the oesophagus) or single contrast (non-ionic iodine based). The study is usually tailored to your symptoms so a good history is essential. Attention is paid during the study to the form, structure and configuration of the esophagus, looking for functional disorders (such as aspiration, dysphagia, achalasia, motility and reflux) EXAMINATION You may be asked to change into a gown, depending on the type of swallow being performed. A radiologist and radiographer will perform  the procedure. The radiologist will advise you of the type of contrast selected for your procedure and direct you during the exam. You will be asked to stand, sit or lie in several different positions and to hold a small amount of fluid in your  mouth before being asked to swallow while the imaging is performed .In some instances you may be asked to swallow barium coated marshmallows to assess the motility of a solid food bolus. The exam can be recorded as a digital or video fluoroscopy procedure. POST PROCEDURE It will take 1-2 days for the barium to pass through your system. To facilitate this, it is important, unless otherwise directed, to increase your fluids for the next 24-48hrs and to resume your normal diet.  This test typically takes about 30 minutes to perform. __________________________________________________________________________________   Due to recent changes in healthcare laws, you may see the results of your imaging and laboratory studies on MyChart before your provider has had a chance to review them.  We understand that in some cases there may be results that are confusing or concerning to you. Not all laboratory results come back in the same time frame and the provider may be waiting for multiple results in order to interpret others.  Please give us  48 hours in order for your provider to thoroughly review all the results before contacting the office for clarification of your results.    I appreciate the  opportunity to care for you  Thank You   [facility]

## 2023-12-17 NOTE — Progress Notes (Signed)
 I agree with the assessment and plan as outlined by Ms. Steffanie Dunn.

## 2024-01-11 ENCOUNTER — Ambulatory Visit (HOSPITAL_COMMUNITY)
Admission: RE | Admit: 2024-01-11 | Discharge: 2024-01-11 | Disposition: A | Discharge status: Home / Self Care | Source: Ambulatory Visit | Attending: Physician Assistant | Admitting: Physician Assistant

## 2024-01-11 DIAGNOSIS — R1319 Other dysphagia: Secondary | ICD-10-CM | POA: Diagnosis present

## 2024-01-11 DIAGNOSIS — K2100 Gastro-esophageal reflux disease with esophagitis, without bleeding: Secondary | ICD-10-CM | POA: Insufficient documentation

## 2024-02-01 NOTE — Progress Notes (Unsigned)
 02/02/2024 DOCTOR SHEAHAN [id number] 20-Feb-1946  Referring provider: Valma Carwin, MD Primary GI doctor: Dr. Federico  ASSESSMENT AND PLAN:  GERD, nocturnal symptoms, intermittent dysphagia liquids solids, worsening over 2 years with start of Ozempic worsening last 2 years with start of ozempic.  No weight loss, melena. No NSAIDS, rare ETOH once a week, no tobacco use, drug use barium swallow 01/11/2024 that showed widely patent Schatzki ring expected to cause symptoms 13 mm passed briskly tertiary contractions esophagus but overall esophageal motility normal - start on protonix  40 mg once a day -alginate therapy given -Lifestyle changes discussed, avoid NSAIDS, ETOH, hand out given to the patient - consider EGD if symptoms return - gastroparesis diet given due to GLP1 - follow up 6 months  CAD with DOE/chest discomfort with exertion Echo 2022 grade 1 diastolic, ER 60-65%, no valve issues Non obstructive, no history of stents, on bASA Has had some progressive DOE and occ exertional chest pain Will refer to cardiology for evaluation, high risk with DM2, family history of CAD, OSA, obesity, never tobacco use ER precautions  Personal history of colon polyps 05/25/2022 colonoscopy Dr. Federico good prep 5 TA polyps 3 to 6 mm transverse ascending colon, diverticulosis sigmoid descending transverse colon and ascending colon, nonbleeding internal hemorrhoids  recall 05/2025  OSA Not using CPAP at this time  Type 2 diabetes On ozempic x 2 years Likely contributing to his symptoms  Morbid obesity  Body mass index is 40.25 kg/m.  -Patient has been advised to make an attempt to improve diet and exercise patterns to aid in weight loss. -Recommended diet heavy in fruits and veggies and low in animal meats, cheeses, and dairy products, appropriate calorie intake  Patient Care Team: Valma Carwin, MD as PCP - General (Internal Medicine)  HISTORY OF PRESENT ILLNESS: 78 y.o. male with a  past medical history listed below presents for evaluation of GERD.   Patient last seen in the office 12/17/2023 by myself for worsening GERD and dysphagia since starting Ozempic.  Symptoms improved with Voquezna now ever this was not covered by insurance.  Started on pantoprazole  40 mg once daily, and had barium swallow 01/11/2024 that showed widely patent Schatzki ring expected to cause symptoms 13 mm passed briskly tertiary contractions esophagus but overall esophageal motility normal  Discussed the use of AI scribe software for clinical note transcription with the patient, who gave verbal consent to proceed.  History of Present Illness   KAYLUB DETIENNE is a 78 year old male with gastroesophageal reflux disease who presents with improved reflux symptoms and occasional swallowing difficulties.  His reflux symptoms have significantly improved since starting on pantoprazole  after initially being on Voquezna, which was not covered by insurance. A barium swallow study showed a Schatzky ring and some tertiary contractions of the esophagus, but no stenosis or narrowing. Swallowing difficulties have improved, occurring about once a week, primarily with food and not liquids. He attributes some issues to not chewing food well, especially given his use of upper dentures. No consistent reflux or heartburn symptoms currently.  He experiences significant shortness of breath and fatigue, which have been persistent for some time. He needs to stop and rest when walking to the front desk. He also experiences coughing, wheezing, and occasionally spits up phlegm. Occasional chest pain occurs, particularly with exertion. He has a history of congestive heart failure diagnosis from a previous doctor who has since retired. He has not had a recent stress test or cardiology evaluation.  His family history is notable for his mother having a heart attack in her sixties. No history of smoking. He reports occasional swelling in his  legs, but no associated redness, warmth, or pain.  He uses a CPAP machine for sleep apnea but finds it bothersome and is interested in alternative treatments, though insurance coverage is an issue.      He  reports that he has never smoked. He has never used smokeless tobacco. He reports current alcohol use. He reports that he does not use drugs.  RELEVANT GI HISTORY, IMAGING AND LABS: Results   RADIOLOGY Barium swallow: No evidence of stenosis, presence of Schatzki ring, no masses, tertiary contractions observed (12/19/2023)     03/2021 echocardiogram for syncope ejection fraction 60 to 65%, grade 1 diastolic, unremarkable valves CBC    Component Value Date/Time   WBC 6.0 12/17/2023 1007   RBC 4.67 12/17/2023 1007   HGB 14.0 12/17/2023 1007   HCT 41.5 12/17/2023 1007   PLT 261.0 12/17/2023 1007   MCV 88.9 12/17/2023 1007   MCH 28.9 03/22/2021 0049   MCHC 33.7 12/17/2023 1007   RDW 14.0 12/17/2023 1007   LYMPHSABS 1.9 12/17/2023 1007   MONOABS 0.7 12/17/2023 1007   EOSABS 0.1 12/17/2023 1007   BASOSABS 0.1 12/17/2023 1007   Recent Labs    12/17/23 1007  HGB 14.0    CMP     Component Value Date/Time   NA 135 12/17/2023 1007   K 4.3 12/17/2023 1007   CL 101 12/17/2023 1007   CO2 29 12/17/2023 1007   GLUCOSE 135 (H) 12/17/2023 1007   BUN 10 12/17/2023 1007   CREATININE 0.96 12/17/2023 1007   CALCIUM 8.9 12/17/2023 1007   PROT 7.5 12/17/2023 1007   ALBUMIN 4.0 12/17/2023 1007   AST 23 12/17/2023 1007   ALT 31 12/17/2023 1007   ALKPHOS 81 12/17/2023 1007   BILITOT 0.5 12/17/2023 1007   GFRNONAA >60 03/22/2021 0049   GFRAA >60 12/26/2016 1930      Latest Ref Rng & Units 12/17/2023   10:07 AM 03/22/2021   12:49 AM 03/21/2021    3:49 AM  Hepatic Function  Total Protein 6.0 - 8.3 g/dL 7.5  5.9  6.3   Albumin 3.5 - 5.2 g/dL 4.0  2.7  2.9   AST 0 - 37 U/L 23  20  24    ALT 0 - 53 U/L 31  20  19    Alk Phosphatase 39 - 117 U/L 81  56  58   Total Bilirubin 0.2 - 1.2  mg/dL 0.5  0.9  0.9       Current Medications:   Current Outpatient Medications (Endocrine & Metabolic):    glimepiride (AMARYL) 4 MG tablet, Take 4 mg by mouth daily.   insulin  degludec (TRESIBA  FLEXTOUCH) 100 UNIT/ML FlexTouch Pen, Inject 30 Units into the skin daily.   Semaglutide,0.25 or 0.5MG /DOS, (OZEMPIC, 0.25 OR 0.5 MG/DOSE,) 2 MG/1.5ML SOPN, Inject 2 mg into the skin once a week.  Current Outpatient Medications (Cardiovascular):    carvedilol  (COREG ) 3.125 MG tablet, Take 1 tablet (3.125 mg total) by mouth 2 (two) times daily with a meal.   pravastatin  (PRAVACHOL ) 20 MG tablet, Take 20 mg by mouth at bedtime.   telmisartan (MICARDIS) 80 MG tablet, Take 80 mg by mouth daily.   Current Outpatient Medications (Analgesics):    aspirin 81 MG chewable tablet, Chew 81 mg by mouth daily.   Current Outpatient Medications (Other):    linaCLOtide LARUE  PO), Take 72 mg by mouth daily.   pantoprazole  (PROTONIX ) 40 MG tablet, Take 1 tablet (40 mg total) by mouth daily.  Medical History:  Past Medical History:  Diagnosis Date   Allergy    Cataract    Diabetes mellitus without complication (HCC)    GERD (gastroesophageal reflux disease)    Hyperlipidemia    Hypertension    MI (myocardial infarction) (HCC)    Renal disorder    reucrrent UTI, nephrolithiasis   Sleep apnea    Allergies: No Known Allergies   Surgical History:  He  has a past surgical history that includes Replacement total knee (Left); cataract surgery (Left); and Bladder stone removal (N/A). Family History:  His family history is not on file.  REVIEW OF SYSTEMS  : All other systems reviewed and negative except where noted in the History of Present Illness.  PHYSICAL EXAM: BP 126/64   Pulse 67   Ht 5' 7 (1.702 m)   Wt 257 lb (116.6 kg)   BMI 40.25 kg/m  Physical Exam   GENERAL APPEARANCE: obese,  in no apparent distress. HEENT: No cervical lymphadenopathy, unremarkable thyroid, sclerae anicteric,  conjunctiva pink, throat normal. RESPIRATORY: Respiratory effort normal, BS equal bilateral without rales, rhonchi, wheezing. CARDIO: RRR with no MRGs, peripheral pulses intact. ABDOMEN: Soft, non distended, active bowel sounds in all 4 quadrants, no tenderness to palpation, no rebound, no mass appreciated. RECTAL: Declines. MUSCULOSKELETAL: Full ROM, normal gait, without edema.Negative homen's SKIN: Dry, intact without rashes or lesions. No jaundice. NEURO: Alert, oriented, no focal deficits. PSYCH: Cooperative, normal mood and affect.      Alan JONELLE Coombs, PA-C 9:50 AM

## 2024-02-02 ENCOUNTER — Encounter: Payer: Self-pay | Admitting: Physician Assistant

## 2024-02-02 ENCOUNTER — Ambulatory Visit (INDEPENDENT_AMBULATORY_CARE_PROVIDER_SITE_OTHER): Admitting: Physician Assistant

## 2024-02-02 VITALS — BP 126/64 | HR 67 | Ht 67.0 in | Wt 257.0 lb

## 2024-02-02 DIAGNOSIS — R0609 Other forms of dyspnea: Secondary | ICD-10-CM

## 2024-02-02 DIAGNOSIS — Z7985 Long-term (current) use of injectable non-insulin antidiabetic drugs: Secondary | ICD-10-CM

## 2024-02-02 DIAGNOSIS — E1169 Type 2 diabetes mellitus with other specified complication: Secondary | ICD-10-CM

## 2024-02-02 DIAGNOSIS — K2100 Gastro-esophageal reflux disease with esophagitis, without bleeding: Secondary | ICD-10-CM

## 2024-02-02 DIAGNOSIS — R1310 Dysphagia, unspecified: Secondary | ICD-10-CM

## 2024-02-02 DIAGNOSIS — G4733 Obstructive sleep apnea (adult) (pediatric): Secondary | ICD-10-CM

## 2024-02-02 DIAGNOSIS — E66813 Obesity, class 3: Secondary | ICD-10-CM

## 2024-02-02 DIAGNOSIS — Z6841 Body Mass Index (BMI) 40.0 and over, adult: Secondary | ICD-10-CM

## 2024-02-02 DIAGNOSIS — Z860101 Personal history of adenomatous and serrated colon polyps: Secondary | ICD-10-CM

## 2024-02-02 DIAGNOSIS — E119 Type 2 diabetes mellitus without complications: Secondary | ICD-10-CM

## 2024-02-02 DIAGNOSIS — R1319 Other dysphagia: Secondary | ICD-10-CM

## 2024-02-02 DIAGNOSIS — I2510 Atherosclerotic heart disease of native coronary artery without angina pectoris: Secondary | ICD-10-CM | POA: Diagnosis not present

## 2024-02-02 NOTE — Progress Notes (Signed)
 I agree with the assessment and plan as outlined by Ms. Steffanie Dunn.

## 2024-02-02 NOTE — Patient Instructions (Addendum)
 Advised to go to the ER if there is any severe weakness, severe abdominal pain, vomit blood, dark red blood in your bowel movement, shortness of breath or chest pain.    Dysphagia precautions:  1. Take reflux medications 30-60+ minutes before food in the morning 2. Begin meals with warm beverage 3. Eat smaller more frequent meals 4. Eat slowly, taking small bites and sips 5. Alternate solids and liquids 6. Avoid foods/liquids that increase acid production 7. Sit upright during and for 30+ minutes after meals to facilitate esophageal clearing 8. All meats should be chopped finely.   If something gets hung in your esophagus and will not come up or go down, proceed to the emergency room.    I appreciate the  opportunity to care for you  Thank You  [facility]

## 2024-04-20 ENCOUNTER — Ambulatory Visit: Attending: Cardiology | Admitting: Cardiology

## 2024-04-20 ENCOUNTER — Encounter: Payer: Self-pay | Admitting: Cardiology

## 2024-04-20 VITALS — BP 120/60 | HR 83 | Ht 67.0 in | Wt 259.0 lb

## 2024-04-20 DIAGNOSIS — I10 Essential (primary) hypertension: Secondary | ICD-10-CM

## 2024-04-20 DIAGNOSIS — R0609 Other forms of dyspnea: Secondary | ICD-10-CM | POA: Diagnosis not present

## 2024-04-20 DIAGNOSIS — E119 Type 2 diabetes mellitus without complications: Secondary | ICD-10-CM

## 2024-04-20 DIAGNOSIS — G4733 Obstructive sleep apnea (adult) (pediatric): Secondary | ICD-10-CM

## 2024-04-20 DIAGNOSIS — R0789 Other chest pain: Secondary | ICD-10-CM

## 2024-04-20 DIAGNOSIS — E669 Obesity, unspecified: Secondary | ICD-10-CM

## 2024-04-20 DIAGNOSIS — R072 Precordial pain: Secondary | ICD-10-CM

## 2024-04-20 MED ORDER — METOPROLOL TARTRATE 100 MG PO TABS: ORAL_TABLET | ORAL | 0 refills | Status: AC

## 2024-04-20 NOTE — Patient Instructions (Addendum)
 Medication Instructions:   TAKE: Metoprolol 100mg  1 tablet 2 hours prior to CT scan   Lab Work: 3rd floor  [address] Labs 3-5 days prior to CT Scan   Testing/Procedures:  Your physician has requested that you have an echocardiogram. Echocardiography is a painless test that uses sound waves to create images of your heart. It provides your doctor with information about the size and shape of your heart and how well your heart's chambers and valves are working. This procedure takes approximately one hour. There are no restrictions for this procedure. Please do NOT wear cologne, perfume, aftershave, or lotions (deodorant is allowed). Please arrive 15 minutes prior to your appointment time.  Please note: We ask at that you not bring children with you during ultrasound (echo/ vascular) testing. Due to room size and safety concerns, children are not allowed in the ultrasound rooms during exams. Our front office staff cannot provide observation of children in our lobby area while testing is being conducted. An adult accompanying a patient to their appointment will only be allowed in the ultrasound room at the discretion of the ultrasound technician under special circumstances. We apologize for any inconvenience.   Your cardiac CT will be scheduled at one of the below locations:   [facility] [address] [place], [place] [address]  Please follow these instructions carefully (unless otherwise directed):  Hold all erectile dysfunction medications at least 3 days (72 hrs) prior to test.  On the Night Before the Test: Be sure to Drink plenty of water. Do not consume any caffeinated/decaffeinated beverages or chocolate 12 hours prior to your test. Do not take any antihistamines 12 hours prior to your test.   On the Day of the Test: Drink plenty of water until 1 hour prior to the test. Do not eat any food 4 hours prior to the test. You may take your regular medications prior to the  test.  Take metoprolol (Lopressor) two hours prior to test. HOLD Furosemide/Hydrochlorothiazide morning of the test.        After the Test: Drink plenty of water. After receiving IV contrast, you may experience a mild flushed feeling. This is normal. On occasion, you may experience a mild rash up to 24 hours after the test. This is not dangerous. If this occurs, you can take Benadryl 25 mg and increase your fluid intake. If you experience trouble breathing, this can be serious. If it is severe call 911 IMMEDIATELY. If it is mild, please call our office. If you take any of these medications: Glipizide/Metformin, Avandament, Glucavance, please do not take 48 hours after completing test unless otherwise instructed.  We will call to schedule your test 2-4 weeks out understanding that some insurance companies will need an authorization prior to the service being performed.   For non-scheduling related questions, please contact the cardiac imaging nurse navigator should you have any questions/concerns: Camie Shutter, Cardiac Imaging Nurse Navigator Chantal Requena, Cardiac Imaging Nurse Navigator [facility] Direct Office Dial: [phone]   For scheduling needs, including cancellations and rescheduling, please call [place], [phone].    Follow-Up: At [facility], you and your health needs are our priority.  As part of our continuing mission to provide you with exceptional heart care, we have created designated Provider Care Teams.  These Care Teams include your primary Cardiologist (physician) and Advanced Practice Providers (APPs -  Physician Assistants and Nurse Practitioners) who all work together to provide you with the care  you need, when you need it.  We recommend signing up for the patient portal called MyChart.  Sign up information is provided on this After Visit Summary.  MyChart is used to connect with patients for Virtual Visits (Telemedicine).   Patients are able to view lab/test results, encounter notes, upcoming appointments, etc.  Non-urgent messages can be sent to your provider as well.   To learn more about what you can do with MyChart, go to [email or web address].    Your next appointment:   2 month(s)  The format for your next appointment:   In Person  Provider:   Lamar Fitch, MD    Other Instructions NA

## 2024-04-20 NOTE — Progress Notes (Signed)
 Cardiology Consultation:    Date:  04/20/2024   ID:  Jake Montgomery, DOB [birth year], MRN [id number]  PCP:  Valma Carwin, MD  Cardiologist:  Lamar Fitch, MD   Referring MD: Craig Alan SAUNDERS, PA-C   Chief Complaint  Patient presents with   Shortness of Breath    History of Present Illness:    Jake Montgomery is a 78 y.o. male who is being seen today for the evaluation of shortness of breath and chest pain at the request of Craig Alan SAUNDERS, PA-C.  Past medical history significant for obesity, type 2 diabetes, GERD, hyperlipidemia, hypertension came to me because of episode of fatigue tiredness shortness of breath with exertion and sometimes chest pain with exertion.  He said gradually creeping up on him and especially within the last few months.  He is trying to be active but activity is limited because of above mentioning symptoms.  He does have diabetes fairly controlled, dyslipidemia on pravastatin , obstructive sleep apnea on CPAP mask.  Does not exercise on the regular basis.  Does have family history of premature coronary artery disease  Past Medical History:  Diagnosis Date   Allergy    Cataract    Diabetes mellitus without complication (HCC)    GERD (gastroesophageal reflux disease)    Hyperlipidemia    Hypertension    MI (myocardial infarction) (HCC)    Renal disorder    reucrrent UTI, nephrolithiasis   Sleep apnea     Past Surgical History:  Procedure Laterality Date   BLADDER STONE REMOVAL N/A    cataract surgery Left    REPLACEMENT TOTAL KNEE Left     Current Medications: Current Meds  Medication Sig   aspirin 81 MG chewable tablet Chew 81 mg by mouth daily.   carvedilol  (COREG ) 3.125 MG tablet Take 1 tablet (3.125 mg total) by mouth 2 (two) times daily with a meal.   glimepiride (AMARYL) 4 MG tablet Take 4 mg by mouth daily.   insulin  degludec (TRESIBA  FLEXTOUCH) 100 UNIT/ML FlexTouch Pen Inject 30 Units into the skin daily.   linaCLOtide (LINZESS  PO) Take 72 mg by mouth daily.   omeprazole (PRILOSEC) 40 MG capsule Take 40 mg by mouth daily.   pravastatin  (PRAVACHOL ) 20 MG tablet Take 20 mg by mouth at bedtime.   Semaglutide,0.25 or 0.5MG /DOS, (OZEMPIC, 0.25 OR 0.5 MG/DOSE,) 2 MG/1.5ML SOPN Inject 2 mg into the skin once a week.   telmisartan (MICARDIS) 80 MG tablet Take 80 mg by mouth daily.     Allergies:   Patient has no known allergies.   Social History   Socioeconomic History   Marital status: Married    Spouse name: Not on file   Number of children: Not on file   Years of education: Not on file   Highest education level: Not on file  Occupational History   Not on file  Tobacco Use   Smoking status: Never   Smokeless tobacco: Never  Substance and Sexual Activity   Alcohol use: Yes    Comment: occ   Drug use: No   Sexual activity: Not on file  Other Topics Concern   Not on file  Social History Narrative   Not on file   Social Drivers of Health   Financial Resource Strain: Low Risk  (10/19/2023)   Received from [facility]   Overall Financial Resource Strain (CARDIA)    Difficulty of Paying Living Expenses: Not hard at all  Food Insecurity: No Food Insecurity (  10/19/2023)   [facility] from [facility]   Hunger Vital Sign    Within the past 12 months, you worried that your food would run out before you got the money to buy more.: Never true    Within the past 12 months, the food you bought just didn't last and you didn't have money to get more.: Never true  Transportation Needs: No Transportation Needs (10/19/2023)   Received from [facility]   PRAPARE - Transportation    Lack of Transportation (Medical): No    Lack of Transportation (Non-Medical): No  Physical Activity: Not on file  Stress: No Stress Concern Present (09/01/2023)   Received from [facility] - Occupational Stress Questionnaire    Feeling of Stress : Only a little  Social Connections: Not on file      Family History: The patient's family history is negative for Colon cancer, Esophageal cancer, Rectal cancer, and Stomach cancer. ROS:   Please see the history of present illness.    All 14 point review of systems negative except as described per history of present illness.  EKGs/Labs/Other Studies Reviewed:    The following studies were reviewed today:   EKG:  EKG Interpretation Date/Time:  Thursday April 20 2024 10:48:57 EDT Ventricular Rate:  83 PR Interval:  196 QRS Duration:  82 QT Interval:  344 QTC Calculation: 404 R Axis:   -13  Text Interpretation: Normal sinus rhythm Minimal voltage criteria for LVH, may be normal variant ( R in aVL ) When compared with ECG of 18-Mar-2021 11:29, PREVIOUS ECG IS PRESENT Confirmed by Bernie Charleston [phone]) on 04/20/2024 11:05:59 AM    Recent Labs: 12/17/2023: ALT 31; BUN 10; Creatinine, Ser 0.96; Hemoglobin 14.0; Platelets 261.0; Potassium 4.3; Sodium 135  Recent Lipid Panel No results found for: CHOL, TRIG, HDL, CHOLHDL, VLDL, LDLCALC, LDLDIRECT  Physical Exam:    VS:  BP 120/60   Pulse 83   Ht 5' 7 (1.702 m)   Wt 259 lb (117.5 kg)   SpO2 97%   BMI 40.57 kg/m     Wt Readings from Last 3 Encounters:  04/20/24 259 lb (117.5 kg)  02/02/24 257 lb (116.6 kg)  12/17/23 254 lb (115.2 kg)     GEN:  Well nourished, well developed in no acute distress HEENT: Normal NECK: No JVD; No carotid bruits LYMPHATICS: No lymphadenopathy CARDIAC: RRR, no murmurs, no rubs, no gallops RESPIRATORY:  Clear to auscultation without rales, wheezing or rhonchi  ABDOMEN: Soft, non-tender, non-distended MUSCULOSKELETAL:  No edema; No deformity  SKIN: Warm and dry NEUROLOGIC:  Alert and oriented x 3 PSYCHIATRIC:  Normal affect   ASSESSMENT:    1. Essential hypertension   2. Dyspnea on exertion   3. Atypical chest pain   4. Type 2 diabetes mellitus in patient with obesity (HCC)   5. OSA on CPAP    PLAN:    In order of  problems listed above:  Dyspnea on exertion, will schedule him to have echocardiogram to assess left ventricle ejection fraction.  I suspect the symptoms if multifactorial some deconditioning as well as weight clearly play significant role here Chest pain with some worrisome characteristic.  He takes a very aspirin which I will continue.  Will schedule him to have coronary CT angio to see if he got any significant coronary disease. Type 2 diabetes apparently fairly controlled however I do not have a recent hemoglobin A1c.  I was actually able to retrieve data  from her primary care physician Hemoglobin A1c from February of this year was 6.6 decent control Dyslipidemia taking pravastatin  we will continue for now in the future we will repeat fasting lipid profile   Medication Adjustments/Labs and Tests Ordered: Current medicines are reviewed at length with the patient today.  Concerns regarding medicines are outlined above.  Orders Placed This Encounter  Procedures   EKG 12-Lead   No orders of the defined types were placed in this encounter.   Signed, Lamar DOROTHA Fitch, MD, [facility]. 04/20/2024 11:15 AM    [facility]

## 2024-05-01 ENCOUNTER — Telehealth (HOSPITAL_COMMUNITY): Payer: Self-pay | Admitting: *Deleted

## 2024-05-01 NOTE — Telephone Encounter (Signed)
 Patient calling about his upcoming cardiac imaging study; pt verbalizes understanding of appt date/time, parking situation and where to check in, pre-test NPO status and medications ordered, and verified current allergies; name and call back number provided for further questions should they arise  Chantal Requena RN Navigator Cardiac Imaging [facility] [phone] office [phone] cell

## 2024-05-01 NOTE — Telephone Encounter (Signed)
 Attempted to call patient regarding upcoming cardiac CT appointment. Left message on voicemail with name and callback number  Larey Brick RN Navigator Cardiac Imaging [facility] [phone] Office [phone] Cell

## 2024-05-02 ENCOUNTER — Encounter (HOSPITAL_BASED_OUTPATIENT_CLINIC_OR_DEPARTMENT_OTHER): Payer: Self-pay

## 2024-05-02 ENCOUNTER — Ambulatory Visit (HOSPITAL_BASED_OUTPATIENT_CLINIC_OR_DEPARTMENT_OTHER)
Admission: RE | Admit: 2024-05-02 | Discharge: 2024-05-02 | Disposition: A | Discharge status: Home / Self Care | Source: Ambulatory Visit | Attending: Cardiology | Admitting: Cardiology

## 2024-05-02 ENCOUNTER — Other Ambulatory Visit: Payer: Self-pay | Admitting: Cardiology

## 2024-05-02 DIAGNOSIS — I2510 Atherosclerotic heart disease of native coronary artery without angina pectoris: Secondary | ICD-10-CM | POA: Insufficient documentation

## 2024-05-02 DIAGNOSIS — R0609 Other forms of dyspnea: Secondary | ICD-10-CM | POA: Insufficient documentation

## 2024-05-02 DIAGNOSIS — R072 Precordial pain: Secondary | ICD-10-CM | POA: Diagnosis present

## 2024-05-02 DIAGNOSIS — R931 Abnormal findings on diagnostic imaging of heart and coronary circulation: Secondary | ICD-10-CM | POA: Insufficient documentation

## 2024-05-02 LAB — POCT I-STAT CREATININE: Creatinine, Ser: 1.2 mg/dL (ref 0.61–1.24)

## 2024-05-02 MED ORDER — IOHEXOL 350 MG/ML SOLN
100.0000 mL | Freq: Once | INTRAVENOUS | Status: AC | PRN
  Administered 2024-05-02: 110 mL via INTRAVENOUS

## 2024-05-02 MED ORDER — NITROGLYCERIN 0.4 MG SL SUBL
0.8000 mg | SUBLINGUAL_TABLET | Freq: Once | SUBLINGUAL | Status: AC
  Administered 2024-05-02: 0.8 mg via SUBLINGUAL

## 2024-05-02 NOTE — Progress Notes (Signed)
 FFR order

## 2024-05-03 ENCOUNTER — Ambulatory Visit (HOSPITAL_BASED_OUTPATIENT_CLINIC_OR_DEPARTMENT_OTHER)
Admission: RE | Admit: 2024-05-03 | Discharge: 2024-05-03 | Disposition: A | Source: Ambulatory Visit | Attending: Cardiology | Admitting: Cardiology

## 2024-05-03 DIAGNOSIS — R0609 Other forms of dyspnea: Secondary | ICD-10-CM

## 2024-05-03 DIAGNOSIS — R072 Precordial pain: Secondary | ICD-10-CM | POA: Diagnosis not present

## 2024-05-03 LAB — ECHOCARDIOGRAM COMPLETE
AR max vel: 2.69 cm2
AV Area VTI: 3.03 cm2
AV Area mean vel: 2.84 cm2
AV Mean grad: 2.5 mmHg
AV Peak grad: 4.1 mmHg
Ao pk vel: 1.02 m/s
Area-P 1/2: 3.06 cm2
Calc EF: 66.9 %
S' Lateral: 2 cm
Single Plane A2C EF: 68 %
Single Plane A4C EF: 65.4 %

## 2024-05-09 ENCOUNTER — Ambulatory Visit: Payer: Self-pay | Admitting: Cardiology

## 2024-06-19 ENCOUNTER — Ambulatory Visit: Admitting: Cardiology

## 2024-08-31 ENCOUNTER — Ambulatory Visit: Admitting: Cardiology
